# Patient Record
Sex: Female | Born: 1980 | Race: Asian | Hispanic: No | Marital: Married | Smoking: Never smoker
Health system: Southern US, Community
[De-identification: ages and names within clinical notes are randomized; demographics above are authoritative.]

## PROBLEM LIST (undated history)

## (undated) DIAGNOSIS — Z0000 Encounter for general adult medical examination without abnormal findings: Secondary | ICD-10-CM

## (undated) DIAGNOSIS — R0789 Other chest pain: Secondary | ICD-10-CM

## (undated) DIAGNOSIS — K5900 Constipation, unspecified: Secondary | ICD-10-CM

## (undated) HISTORY — DX: Constipation, unspecified: K59.00

## (undated) HISTORY — DX: Other chest pain: R07.89

## (undated) HISTORY — DX: Encounter for general adult medical examination without abnormal findings: Z00.00

---

## 2012-06-26 DIAGNOSIS — Z3490 Encounter for supervision of normal pregnancy, unspecified, unspecified trimester: Secondary | ICD-10-CM | POA: Insufficient documentation

## 2012-06-26 HISTORY — DX: Encounter for supervision of normal pregnancy, unspecified, unspecified trimester: Z34.90

## 2013-08-08 ENCOUNTER — Ambulatory Visit (INDEPENDENT_AMBULATORY_CARE_PROVIDER_SITE_OTHER): Payer: BC Managed Care – PPO | Admitting: Family Medicine

## 2013-08-08 ENCOUNTER — Encounter: Payer: Self-pay | Admitting: Family Medicine

## 2013-08-08 ENCOUNTER — Other Ambulatory Visit (HOSPITAL_COMMUNITY)
Admission: RE | Admit: 2013-08-08 | Discharge: 2013-08-08 | Disposition: A | Discharge status: Home / Self Care | Payer: BC Managed Care – PPO | Source: Ambulatory Visit | Attending: Family Medicine | Admitting: Family Medicine

## 2013-08-08 VITALS — BP 100/62 | HR 60 | Temp 97.9°F | Ht <= 58 in | Wt 114.1 lb

## 2013-08-08 DIAGNOSIS — N390 Urinary tract infection, site not specified: Secondary | ICD-10-CM

## 2013-08-08 DIAGNOSIS — N644 Mastodynia: Secondary | ICD-10-CM

## 2013-08-08 DIAGNOSIS — N760 Acute vaginitis: Secondary | ICD-10-CM | POA: Insufficient documentation

## 2013-08-08 DIAGNOSIS — Z113 Encounter for screening for infections with a predominantly sexual mode of transmission: Secondary | ICD-10-CM | POA: Insufficient documentation

## 2013-08-08 DIAGNOSIS — D649 Anemia, unspecified: Secondary | ICD-10-CM

## 2013-08-08 LAB — CBC
HCT: 39 % (ref 36.0–46.0)
HEMOGLOBIN: 13.1 g/dL (ref 12.0–15.0)
MCH: 31.3 pg (ref 26.0–34.0)
MCHC: 33.6 g/dL (ref 30.0–36.0)
MCV: 93.1 fL (ref 78.0–100.0)
Platelets: 274 10*3/uL (ref 150–400)
RBC: 4.19 MIL/uL (ref 3.87–5.11)
RDW: 13.2 % (ref 11.5–15.5)
WBC: 6.2 10*3/uL (ref 4.0–10.5)

## 2013-08-08 NOTE — Progress Notes (Signed)
Pre visit review using our clinic review tool, if applicable. No additional management support is needed unless otherwise documented below in the visit note. 

## 2013-08-08 NOTE — Patient Instructions (Addendum)
Pre visit review using our clinic review tool, if applicable. No additional management support is needed unless otherwise documented below in the visit  Probiotic daily such as Digestive Advantage or [employer] for Adults, Female A healthy lifestyle and preventive care can promote health and wellness. Preventive health guidelines for women include the following key practices.  A routine yearly physical is a good way to check with your health care provider about your health and preventive screening. It is a chance to share any concerns and updates on your health and to receive a thorough exam.  Visit your dentist for a routine exam and preventive care every 6 months. Brush your teeth twice a day and floss once a day. Good oral hygiene prevents tooth decay and gum disease.  The frequency of eye exams is based on your age, health, family medical history, use of contact lenses, and other factors. Follow your health care provider's recommendations for frequency of eye exams.  Eat a healthy diet. Foods like vegetables, fruits, whole grains, low-fat dairy products, and lean protein foods contain the nutrients you need without too many calories. Decrease your intake of foods high in solid fats, added sugars, and salt. Eat the right amount of calories for you.Get information about a proper diet from your health care provider, if necessary.  Regular physical exercise is one of the most important things you can do for your health. Most adults should get at least 150 minutes of moderate-intensity exercise (any activity that increases your heart rate and causes you to sweat) each week. In addition, most adults need muscle-strengthening exercises on 2 or more days a week.  Maintain a healthy weight. The body mass index (BMI) is a screening tool to identify possible weight problems. It provides an estimate of body fat based on height and weight. Your health care provider can find your  BMI, and can help you achieve or maintain a healthy weight.For adults 20 years and older:  A BMI below 18.5 is considered underweight.  A BMI of 18.5 to 24.9 is normal.  A BMI of 25 to 29.9 is considered overweight.  A BMI of 30 and above is considered obese.  Maintain normal blood lipids and cholesterol levels by exercising and minimizing your intake of saturated fat. Eat a balanced diet with plenty of fruit and vegetables. Blood tests for lipids and cholesterol should begin at age 19 and be repeated every 5 years. If your lipid or cholesterol levels are high, you are over 50, or you are at high risk for heart disease, you may need your cholesterol levels checked more frequently.Ongoing high lipid and cholesterol levels should be treated with medicines if diet and exercise are not working.  If you smoke, find out from your health care provider how to quit. If you do not use tobacco, do not start.  Lung cancer screening is recommended for adults aged 37 80 years who are at high risk for developing lung cancer because of a history of smoking. A yearly low-dose CT scan of the lungs is recommended for people who have at least a 30-pack-year history of smoking and are a current smoker or have quit within the past 15 years. A pack year of smoking is smoking an average of 1 pack of cigarettes a day for 1 year (for example: 1 pack a day for 30 years or 2 packs a day for 15 years). Yearly screening should continue until the smoker has stopped smoking for at  least 15 years. Yearly screening should be stopped for people who develop a health problem that would prevent them from having lung cancer treatment.  If you are pregnant, do not drink alcohol. If you are breastfeeding, be very cautious about drinking alcohol. If you are not pregnant and choose to drink alcohol, do not have more than 1 drink per day. One drink is considered to be 12 ounces (355 mL) of beer, 5 ounces (148 mL) of wine, or 1.5 ounces (44  mL) of liquor.  Avoid use of street drugs. Do not share needles with anyone. Ask for help if you need support or instructions about stopping the use of drugs.  High blood pressure causes heart disease and increases the risk of stroke. Your blood pressure should be checked at least every 1 to 2 years. Ongoing high blood pressure should be treated with medicines if weight loss and exercise do not work.  If you are 16 33 years old, ask your health care provider if you should take aspirin to prevent strokes.  Diabetes screening involves taking a blood sample to check your fasting blood sugar level. This should be done once every 3 years, after age 4, if you are within normal weight and without risk factors for diabetes. Testing should be considered at a younger age or be carried out more frequently if you are overweight and have at least 1 risk factor for diabetes.  Breast cancer screening is essential preventive care for women. You should practice "breast self-awareness." This means understanding the normal appearance and feel of your breasts and may include breast self-examination. Any changes detected, no matter how small, should be reported to a health care provider. Women in their 54s and 30s should have a clinical breast exam (CBE) by a health care provider as part of a regular health exam every 1 to 3 years. After age 68, women should have a CBE every year. Starting at age 57, women should consider having a mammogram (breast X-ray test) every year. Women who have a family history of breast cancer should talk to their health care provider about genetic screening. Women at a high risk of breast cancer should talk to their health care providers about having an MRI and a mammogram every year.  Breast cancer gene (BRCA)-related cancer risk assessment is recommended for women who have family members with BRCA-related cancers. BRCA-related cancers include breast, ovarian, tubal, and peritoneal cancers.  Having family members with these cancers may be associated with an increased risk for harmful changes (mutations) in the breast cancer genes BRCA1 and BRCA2. Results of the assessment will determine the need for genetic counseling and BRCA1 and BRCA2 testing.  The Pap test is a screening test for cervical cancer. A Pap test can show cell changes on the cervix that might become cervical cancer if left untreated. A Pap test is a procedure in which cells are obtained and examined from the lower end of the uterus (cervix).  Women should have a Pap test starting at age 38.  Between ages 82 and 66, Pap tests should be repeated every 2 years.  Beginning at age 65, you should have a Pap test every 3 years as long as the past 3 Pap tests have been normal.  Some women have medical problems that increase the chance of getting cervical cancer. Talk to your health care provider about these problems. It is especially important to talk to your health care provider if a new problem develops soon after your  last Pap test. In these cases, your health care provider may recommend more frequent screening and Pap tests.  The above recommendations are the same for women who have or have not gotten the vaccine for human papillomavirus (HPV).  If you had a hysterectomy for a problem that was not cancer or a condition that could lead to cancer, then you no longer need Pap tests. Even if you no longer need a Pap test, a regular exam is a good idea to make sure no other problems are starting.  If you are between ages 26 and 31 years, and you have had normal Pap tests going back 10 years, you no longer need Pap tests. Even if you no longer need a Pap test, a regular exam is a good idea to make sure no other problems are starting.  If you have had past treatment for cervical cancer or a condition that could lead to cancer, you need Pap tests and screening for cancer for at least 20 years after your treatment.  If Pap tests  have been discontinued, risk factors (such as a new sexual partner) need to be reassessed to determine if screening should be resumed.  The HPV test is an additional test that may be used for cervical cancer screening. The HPV test looks for the virus that can cause the cell changes on the cervix. The cells collected during the Pap test can be tested for HPV. The HPV test could be used to screen women aged 29 years and older, and should be used in women of any age who have unclear Pap test results. After the age of 70, women should have HPV testing at the same frequency as a Pap test.  Colorectal cancer can be detected and often prevented. Most routine colorectal cancer screening begins at the age of 40 years and continues through age 21 years. However, your health care provider may recommend screening at an earlier age if you have risk factors for colon cancer. On a yearly basis, your health care provider may provide home test kits to check for hidden blood in the stool. Use of a small camera at the end of a tube, to directly examine the colon (sigmoidoscopy or colonoscopy), can detect the earliest forms of colorectal cancer. Talk to your health care provider about this at age 60, when routine screening begins. Direct exam of the colon should be repeated every 5 10 years through age 34 years, unless early forms of pre-cancerous polyps or small growths are found.  People who are at an increased risk for hepatitis B should be screened for this virus. You are considered at high risk for hepatitis B if:  You were born in a country where hepatitis B occurs often. Talk with your health care provider about which countries are considered high risk.  Your parents were born in a high-risk country and you have not received a shot to protect against hepatitis B (hepatitis B vaccine).  You have HIV or AIDS.  You use needles to inject street drugs.  You live with, or have sex with, someone who has Hepatitis  B.  You get hemodialysis treatment.  You take certain medicines for conditions like cancer, organ transplantation, and autoimmune conditions.  Hepatitis C blood testing is recommended for all people born from [birth year] through 1965 and any individual with known risks for hepatitis C.  Practice safe sex. Use condoms and avoid high-risk sexual practices to reduce the spread of sexually transmitted infections (STIs). STIs include  gonorrhea, chlamydia, syphilis, trichomonas, herpes, HPV, and human immunodeficiency virus (HIV). Herpes, HIV, and HPV are viral illnesses that have no cure. They can result in disability, cancer, and death. Sexually active women aged 74 years and younger should be checked for chlamydia. Older women with new or multiple partners should also be tested for chlamydia. Testing for other STIs is recommended if you are sexually active and at increased risk.  Osteoporosis is a disease in which the bones lose minerals and strength with aging. This can result in serious bone fractures or breaks. The risk of osteoporosis can be identified using a bone density scan. Women ages 47 years and over and women at risk for fractures or osteoporosis should discuss screening with their health care providers. Ask your health care provider whether you should take a calcium supplement or vitamin D to reduce the rate of osteoporosis.  Menopause can be associated with physical symptoms and risks. Hormone replacement therapy is available to decrease symptoms and risks. You should talk to your health care provider about whether hormone replacement therapy is right for you.  Use sunscreen. Apply sunscreen liberally and repeatedly throughout the day. You should seek shade when your shadow is shorter than you. Protect yourself by wearing long sleeves, pants, a wide-brimmed hat, and sunglasses year round, whenever you are outdoors.  Once a month, do a whole body skin exam, using a mirror to look at the skin on  your back. Tell your health care provider of new moles, moles that have irregular borders, moles that are larger than a pencil eraser, or moles that have changed in shape or color.  Stay current with required vaccines (immunizations).  Influenza vaccine. All adults should be immunized every year.  Tetanus, diphtheria, and acellular pertussis (Td, Tdap) vaccine. Pregnant women should receive 1 dose of Tdap vaccine during each pregnancy. The dose should be obtained regardless of the length of time since the last dose. Immunization is preferred during the 27th 36th week of gestation. An adult who has not previously received Tdap or who does not know her vaccine status should receive 1 dose of Tdap. This initial dose should be followed by tetanus and diphtheria toxoids (Td) booster doses every 10 years. Adults with an unknown or incomplete history of completing a 3-dose immunization series with Td-containing vaccines should begin or complete a primary immunization series including a Tdap dose. Adults should receive a Td booster every 10 years.  Varicella vaccine. An adult without evidence of immunity to varicella should receive 2 doses or a second dose if she has previously received 1 dose. Pregnant females who do not have evidence of immunity should receive the first dose after pregnancy. This first dose should be obtained before leaving the health care facility. The second dose should be obtained 4 8 weeks after the first dose.  Human papillomavirus (HPV) vaccine. Females aged 32 26 years who have not received the vaccine previously should obtain the 3-dose series. The vaccine is not recommended for use in pregnant females. However, pregnancy testing is not needed before receiving a dose. If a female is found to be pregnant after receiving a dose, no treatment is needed. In that case, the remaining doses should be delayed until after the pregnancy. Immunization is recommended for any person with an  immunocompromised condition through the age of 33 years if she did not get any or all doses earlier. During the 3-dose series, the second dose should be obtained 4 8 weeks after the first dose.  The third dose should be obtained 24 weeks after the first dose and 16 weeks after the second dose.  Zoster vaccine. One dose is recommended for adults aged 8 years or older unless certain conditions are present.  Measles, mumps, and rubella (MMR) vaccine. Adults born before 80 generally are considered immune to measles and mumps. Adults born in [birth year] or later should have 1 or more doses of MMR vaccine unless there is a contraindication to the vaccine or there is laboratory evidence of immunity to each of the three diseases. A routine second dose of MMR vaccine should be obtained at least 28 days after the first dose for students attending postsecondary schools, health care workers, or international travelers. People who received inactivated measles vaccine or an unknown type of measles vaccine during 1963 1967 should receive 2 doses of MMR vaccine. People who received inactivated mumps vaccine or an unknown type of mumps vaccine before 1979 and are at high risk for mumps infection should consider immunization with 2 doses of MMR vaccine. For females of childbearing age, rubella immunity should be determined. If there is no evidence of immunity, females who are not pregnant should be vaccinated. If there is no evidence of immunity, females who are pregnant should delay immunization until after pregnancy. Unvaccinated health care workers born before [birth year] who lack laboratory evidence of measles, mumps, or rubella immunity or laboratory confirmation of disease should consider measles and mumps immunization with 2 doses of MMR vaccine or rubella immunization with 1 dose of MMR vaccine.  Pneumococcal 13-valent conjugate (PCV13) vaccine. When indicated, a person who is uncertain of her immunization history and has no record  of immunization should receive the PCV13 vaccine. An adult aged 37 years or older who has certain medical conditions and has not been previously immunized should receive 1 dose of PCV13 vaccine. This PCV13 should be followed with a dose of pneumococcal polysaccharide (PPSV23) vaccine. The PPSV23 vaccine dose should be obtained at least 8 weeks after the dose of PCV13 vaccine. An adult aged 51 years or older who has certain medical conditions and previously received 1 or more doses of PPSV23 vaccine should receive 1 dose of PCV13. The PCV13 vaccine dose should be obtained 1 or more years after the last PPSV23 vaccine dose.  Pneumococcal polysaccharide (PPSV23) vaccine. When PCV13 is also indicated, PCV13 should be obtained first. All adults aged 42 years and older should be immunized. An adult younger than age 63 years who has certain medical conditions should be immunized. Any person who resides in a nursing home or long-term care facility should be immunized. An adult smoker should be immunized. People with an immunocompromised condition and certain other conditions should receive both PCV13 and PPSV23 vaccines. People with human immunodeficiency virus (HIV) infection should be immunized as soon as possible after diagnosis. Immunization during chemotherapy or radiation therapy should be avoided. Routine use of PPSV23 vaccine is not recommended for American Indians, [place] Natives, or people younger than 65 years unless there are medical conditions that require PPSV23 vaccine. When indicated, people who have unknown immunization and have no record of immunization should receive PPSV23 vaccine. One-time revaccination 5 years after the first dose of PPSV23 is recommended for people aged 64 64 years who have chronic kidney failure, nephrotic syndrome, asplenia, or immunocompromised conditions. People who received 1 2 doses of PPSV23 before age 39 years should receive another dose of PPSV23 vaccine at age 6 years or  later if at least 5 years have passed  since the previous dose. Doses of PPSV23 are not needed for people immunized with PPSV23 at or after age 18 years.  Meningococcal vaccine. Adults with asplenia or persistent complement component deficiencies should receive 2 doses of quadrivalent meningococcal conjugate (MenACWY-D) vaccine. The doses should be obtained at least 2 months apart. Microbiologists working with certain meningococcal bacteria, [place] recruits, people at risk during an outbreak, and people who travel to or live in countries with a high rate of meningitis should be immunized. A first-year college student up through age 79 years who is living in a residence hall should receive a dose if she did not receive a dose on or after her 16th birthday. Adults who have certain high-risk conditions should receive one or more doses of vaccine.  Hepatitis A vaccine. Adults who wish to be protected from this disease, have certain high-risk conditions, work with hepatitis A-infected animals, work in hepatitis A research labs, or travel to or work in countries with a high rate of hepatitis A should be immunized. Adults who were previously unvaccinated and who anticipate close contact with an international adoptee during the first 60 days after arrival in the [place] from a country with a high rate of hepatitis A should be immunized.  Hepatitis B vaccine. Adults who wish to be protected from this disease, have certain high-risk conditions, may be exposed to blood or other infectious body fluids, are household contacts or sex partners of hepatitis B positive people, are clients or workers in certain care facilities, or travel to or work in countries with a high rate of hepatitis B should be immunized.  Haemophilus influenzae type b (Hib) vaccine. A previously unvaccinated person with asplenia or sickle cell disease or having a scheduled splenectomy should receive 1 dose of Hib vaccine. Regardless of  previous immunization, a recipient of a hematopoietic stem cell transplant should receive a 3-dose series 6 12 months after her successful transplant. Hib vaccine is not recommended for adults with HIV infection. Preventive Services / Frequency Ages 37 to 39years  Blood pressure check.** / Every 1 to 2 years.  Lipid and cholesterol check.** / Every 5 years beginning at age 40.  Clinical breast exam.** / Every 3 years for women in their 61s and 74s.  BRCA-related cancer risk assessment.** / For women who have family members with a BRCA-related cancer (breast, ovarian, tubal, or peritoneal cancers).  Pap test.** / Every 2 years from ages 80 through 63. Every 3 years starting at age 9 through age 88 or 38 with a history of 3 consecutive normal Pap tests.  HPV screening.** / Every 3 years from ages 28 through ages 43 to 4 with a history of 3 consecutive normal Pap tests.  Hepatitis C blood test.** / For any individual with known risks for hepatitis C.  Skin self-exam. / Monthly.  Influenza vaccine. / Every year.  Tetanus, diphtheria, and acellular pertussis (Tdap, Td) vaccine.** / Consult your health care provider. Pregnant women should receive 1 dose of Tdap vaccine during each pregnancy. 1 dose of Td every 10 years.  Varicella vaccine.** / Consult your health care provider. Pregnant females who do not have evidence of immunity should receive the first dose after pregnancy.  HPV vaccine. / 3 doses over 6 months, if 27 and younger. The vaccine is not recommended for use in pregnant females. However, pregnancy testing is not needed before receiving a dose.  Measles, mumps, rubella (MMR) vaccine.** / You need at least 1 dose of MMR if  you were born in [birth year] or later. You may also need a 2nd dose. For females of childbearing age, rubella immunity should be determined. If there is no evidence of immunity, females who are not pregnant should be vaccinated. If there is no evidence of immunity,  females who are pregnant should delay immunization until after pregnancy.  Pneumococcal 13-valent conjugate (PCV13) vaccine.** / Consult your health care provider.  Pneumococcal polysaccharide (PPSV23) vaccine.** / 1 to 2 doses if you smoke cigarettes or if you have certain conditions.  Meningococcal vaccine.** / 1 dose if you are age 18 to 85 years and a Market researcher living in a residence hall, or have one of several medical conditions, you need to get vaccinated against meningococcal disease. You may also need additional booster doses.  Hepatitis A vaccine.** / Consult your health care provider.  Hepatitis B vaccine.** / Consult your health care provider.  Haemophilus influenzae type b (Hib) vaccine.** / Consult your health care provider. Ages 52 to 64years  Blood pressure check.** / Every 1 to 2 years.  Lipid and cholesterol check.** / Every 5 years beginning at age 15 years.  Lung cancer screening. / Every year if you are aged 12 80 years and have a 30-pack-year history of smoking and currently smoke or have quit within the past 15 years. Yearly screening is stopped once you have quit smoking for at least 15 years or develop a health problem that would prevent you from having lung cancer treatment.  Clinical breast exam.** / Every year after age 53 years.  BRCA-related cancer risk assessment.** / For women who have family members with a BRCA-related cancer (breast, ovarian, tubal, or peritoneal cancers).  Mammogram.** / Every year beginning at age 1 years and continuing for as long as you are in good health. Consult with your health care provider.  Pap test.** / Every 3 years starting at age 98 years through age 17 or 49 years with a history of 3 consecutive normal Pap tests.  HPV screening.** / Every 3 years from ages 72 years through ages 4 to 53 years with a history of 3 consecutive normal Pap tests.  Fecal occult blood test (FOBT) of stool. / Every year  beginning at age 62 years and continuing until age 70 years. You may not need to do this test if you get a colonoscopy every 10 years.  Flexible sigmoidoscopy or colonoscopy.** / Every 5 years for a flexible sigmoidoscopy or every 10 years for a colonoscopy beginning at age 9 years and continuing until age 22 years.  Hepatitis C blood test.** / For all people born from [birth year] through 1965 and any individual with known risks for hepatitis C.  Skin self-exam. / Monthly.  Influenza vaccine. / Every year.  Tetanus, diphtheria, and acellular pertussis (Tdap/Td) vaccine.** / Consult your health care provider. Pregnant women should receive 1 dose of Tdap vaccine during each pregnancy. 1 dose of Td every 10 years.  Varicella vaccine.** / Consult your health care provider. Pregnant females who do not have evidence of immunity should receive the first dose after pregnancy.  Zoster vaccine.** / 1 dose for adults aged 61 years or older.  Measles, mumps, rubella (MMR) vaccine.** / You need at least 1 dose of MMR if you were born in 1957 or later. You may also need a 2nd dose. For females of childbearing age, rubella immunity should be determined. If there is no evidence of immunity, females who are not pregnant should be vaccinated. If  there is no evidence of immunity, females who are pregnant should delay immunization until after pregnancy.  Pneumococcal 13-valent conjugate (PCV13) vaccine.** / Consult your health care provider.  Pneumococcal polysaccharide (PPSV23) vaccine.** / 1 to 2 doses if you smoke cigarettes or if you have certain conditions.  Meningococcal vaccine.** / Consult your health care provider.  Hepatitis A vaccine.** / Consult your health care provider.  Hepatitis B vaccine.** / Consult your health care provider.  Haemophilus influenzae type b (Hib) vaccine.** / Consult your health care provider. Ages 53 years and over  Blood pressure check.** / Every 1 to 2 years.  Lipid and  cholesterol check.** / Every 5 years beginning at age 53 years.  Lung cancer screening. / Every year if you are aged 39 80 years and have a 30-pack-year history of smoking and currently smoke or have quit within the past 15 years. Yearly screening is stopped once you have quit smoking for at least 15 years or develop a health problem that would prevent you from having lung cancer treatment.  Clinical breast exam.** / Every year after age 85 years.  BRCA-related cancer risk assessment.** / For women who have family members with a BRCA-related cancer (breast, ovarian, tubal, or peritoneal cancers).  Mammogram.** / Every year beginning at age 19 years and continuing for as long as you are in good health. Consult with your health care provider.  Pap test.** / Every 3 years starting at age 17 years through age 101 or 95 years with 3 consecutive normal Pap tests. Testing can be stopped between 65 and 70 years with 3 consecutive normal Pap tests and no abnormal Pap or HPV tests in the past 10 years.  HPV screening.** / Every 3 years from ages 49 years through ages 31 or 57 years with a history of 3 consecutive normal Pap tests. Testing can be stopped between 65 and 70 years with 3 consecutive normal Pap tests and no abnormal Pap or HPV tests in the past 10 years.  Fecal occult blood test (FOBT) of stool. / Every year beginning at age 55 years and continuing until age 47 years. You may not need to do this test if you get a colonoscopy every 10 years.  Flexible sigmoidoscopy or colonoscopy.** / Every 5 years for a flexible sigmoidoscopy or every 10 years for a colonoscopy beginning at age 26 years and continuing until age 32 years.  Hepatitis C blood test.** / For all people born from [birth year] through 1965 and any individual with known risks for hepatitis C.  Osteoporosis screening.** / A one-time screening for women ages 20 years and over and women at risk for fractures or osteoporosis.  Skin self-exam. /  Monthly.  Influenza vaccine. / Every year.  Tetanus, diphtheria, and acellular pertussis (Tdap/Td) vaccine.** / 1 dose of Td every 10 years.  Varicella vaccine.** / Consult your health care provider.  Zoster vaccine.** / 1 dose for adults aged 85 years or older.  Pneumococcal 13-valent conjugate (PCV13) vaccine.** / Consult your health care provider.  Pneumococcal polysaccharide (PPSV23) vaccine.** / 1 dose for all adults aged 21 years and older.  Meningococcal vaccine.** / Consult your health care provider.  Hepatitis A vaccine.** / Consult your health care provider.  Hepatitis B vaccine.** / Consult your health care provider.  Haemophilus influenzae type b (Hib) vaccine.** / Consult your health care provider. ** Family history and personal history of risk and conditions may change your health care provider's recommendations. Document Released: 04/25/2001 Document Revised:  12/18/2012 Document Reviewed: 07/25/2010 [employer] Patient Information 2014 [place].

## 2013-08-09 LAB — URINALYSIS
Bilirubin Urine: NEGATIVE
Glucose, UA: NEGATIVE mg/dL
Hgb urine dipstick: NEGATIVE
Ketones, ur: NEGATIVE mg/dL
Leukocytes, UA: NEGATIVE
Nitrite: NEGATIVE
PH: 6.5 (ref 5.0–8.0)
Protein, ur: NEGATIVE mg/dL
SPECIFIC GRAVITY, URINE: 1.019 (ref 1.005–1.030)
Urobilinogen, UA: 0.2 mg/dL (ref 0.0–1.0)

## 2013-08-09 LAB — TSH: TSH: 1.237 u[IU]/mL (ref 0.350–4.500)

## 2013-08-10 DIAGNOSIS — N390 Urinary tract infection, site not specified: Secondary | ICD-10-CM | POA: Insufficient documentation

## 2013-08-10 DIAGNOSIS — D649 Anemia, unspecified: Secondary | ICD-10-CM | POA: Insufficient documentation

## 2013-08-10 DIAGNOSIS — N644 Mastodynia: Secondary | ICD-10-CM | POA: Insufficient documentation

## 2013-08-10 DIAGNOSIS — N760 Acute vaginitis: Secondary | ICD-10-CM | POA: Insufficient documentation

## 2013-08-10 LAB — URINE CULTURE: Colony Count: 2000

## 2013-08-10 NOTE — Assessment & Plan Note (Signed)
Is nursing her infant, likely normal variant tissue and discomfort. No obvious sign or symptoms of mastitis. Will refer for breast ultrasound for reassurance.

## 2013-08-10 NOTE — Progress Notes (Signed)
Patient ID: Heather Mann, female   DOB: 16-Dec-1980, 33 y.o.   MRN: [id number] Heather Mann [id number] 07-14-1980 08/10/2013      Progress Note New Patient  Subjective  Chief Complaint  Chief Complaint  Patient presents with  . Establish Care    new patient    HPI  Patient is a 33 year old female in today for routine medical care. Patient is here today to establish care. She has a 26-month-old daughter she is nursing. She has significant rest pain and swelling. Denies fevers, chills, malaise, myalgias. She is complaining of vaginal itching and discharge. She denies abdominal pain or back pain. She has some urinary frequency and urgency but no dysuria or hematuria. Denies CP/palp/SOB/HA/congestion/fevers/GI or GU c/o. Taking meds as prescribed  History reviewed. No pertinent past medical history.  Past Surgical History  Procedure Laterality Date  . Cesarean section  01-22-13    History reviewed. No pertinent family history.  History   Social History  . Marital Status: Married    Spouse Name: N/A    Number of Children: N/A  . Years of Education: N/A   Occupational History  . Not on file.   Social History Main Topics  . Smoking status: Never Smoker   . Smokeless tobacco: Never Used  . Alcohol Use: No  . Drug Use: No  . Sexual Activity: Yes    Partners: Male   Other Topics Concern  . Not on file   Social History Narrative  . No narrative on file    No current outpatient prescriptions on file prior to visit.   No current facility-administered medications on file prior to visit.    No Known Allergies  Review of Systems  Review of Systems  Constitutional: Negative for fever and malaise/fatigue.  HENT: Negative for congestion.   Eyes: Negative for discharge.  Respiratory: Negative for shortness of breath.   Cardiovascular: Negative for chest pain, palpitations and leg swelling.  Gastrointestinal: Negative for nausea, abdominal pain and diarrhea.   Genitourinary: Positive for frequency. Negative for dysuria.  Musculoskeletal: Negative for falls.  Skin: Negative for rash.  Neurological: Negative for loss of consciousness and headaches.  Endo/Heme/Allergies: Negative for polydipsia.  Psychiatric/Behavioral: Negative for depression and suicidal ideas. The patient is not nervous/anxious and does not have insomnia.     Objective  BP 100/62  Pulse 60  Temp(Src) 97.9 F (36.6 C) (Oral)  Ht 4\' 10"  (1.473 m)  Wt 114 lb 1.3 oz (51.746 kg)  BMI 23.85 kg/m2  SpO2 98%  Physical Exam  Physical Exam  Constitutional: She is oriented to person, place, and time and well-developed, well-nourished, and in no distress. No distress.  HENT:  Head: Normocephalic and atraumatic.  Eyes: Conjunctivae are normal.  Neck: Neck supple. No thyromegaly present.  Cardiovascular: Normal rate, regular rhythm and normal heart sounds.   No murmur heard. Pulmonary/Chest: Effort normal and breath sounds normal. She has no wheezes.  Abdominal: Soft. Bowel sounds are normal. She exhibits no distension and no mass.  Genitourinary:  Breasts with significant lumps and tenderness. b/l  Musculoskeletal: She exhibits no edema.  Lymphadenopathy:    She has no cervical adenopathy.  Neurological: She is alert and oriented to person, place, and time.  Skin: Skin is warm and dry. No rash noted. She is not diaphoretic.  Psychiatric: Memory, affect and judgment normal.       Assessment & Plan  UTI (urinary tract infection) H/o uti, encouraged probiotics and increased hydration  Breast pain  in female Is nursing her infant, likely normal variant tissue and discomfort. No obvious sign or symptoms of mastitis. Will refer for breast ultrasound for reassurance.  Anemia Check CBC continue prenatal vitamin  Vaginitis and vulvovaginitis Encouraged probiotics and check for yeast and vaginitis

## 2013-08-10 NOTE — Assessment & Plan Note (Signed)
Check CBC continue prenatal vitamin

## 2013-08-10 NOTE — Assessment & Plan Note (Signed)
H/o uti, encouraged probiotics and increased hydration

## 2013-08-10 NOTE — Assessment & Plan Note (Signed)
Encouraged probiotics and check for yeast and vaginitis

## 2013-08-18 ENCOUNTER — Other Ambulatory Visit: Payer: Self-pay | Admitting: Family Medicine

## 2013-08-18 DIAGNOSIS — N644 Mastodynia: Secondary | ICD-10-CM

## 2013-09-05 ENCOUNTER — Ambulatory Visit
Admission: RE | Admit: 2013-09-05 | Discharge: 2013-09-05 | Disposition: A | Discharge status: Home / Self Care | Payer: BC Managed Care – PPO | Source: Ambulatory Visit | Attending: Family Medicine | Admitting: Family Medicine

## 2013-09-05 DIAGNOSIS — N644 Mastodynia: Secondary | ICD-10-CM

## 2013-12-19 ENCOUNTER — Encounter: Payer: Self-pay | Admitting: Family

## 2013-12-19 ENCOUNTER — Ambulatory Visit (INDEPENDENT_AMBULATORY_CARE_PROVIDER_SITE_OTHER): Payer: BC Managed Care – PPO | Admitting: Family

## 2013-12-19 VITALS — BP 104/61 | HR 70 | Temp 98.5°F | Resp 16 | Ht <= 58 in | Wt 108.4 lb

## 2013-12-19 DIAGNOSIS — M791 Myalgia, unspecified site: Secondary | ICD-10-CM

## 2013-12-19 DIAGNOSIS — D508 Other iron deficiency anemias: Secondary | ICD-10-CM

## 2013-12-19 MED ORDER — MULTI-VITAMIN/MINERALS PO TABS: 1.0000 | ORAL_TABLET | Freq: Every day | ORAL | Status: DC

## 2013-12-19 MED ORDER — MELOXICAM 7.5 MG PO TABS: 7.5000 mg | ORAL_TABLET | Freq: Every day | ORAL | Status: DC

## 2013-12-19 NOTE — Patient Instructions (Signed)
Start meloxicam for your back. This can be taken once daily for the next 1-2 weeks. Call if your back pain worsens, or if it does not improve with the meloxicam.  It is OK to stop iron supplement.  Instead take a multivitamin with minerals once daily.

## 2013-12-19 NOTE — Progress Notes (Signed)
   Subjective:    Patient ID: Heather Mann, female    DOB: 03/23/1980, 34 y.o.   MRN: [id number]  HPI  Heather Mann is a 33 yr old female who presents today with chief complaint of pain in the upper back.  Has some tingling across her chest. Reports fatigue in her upper shoulders. Symptoms started 1 year ago.  Pain is intermittent.  Pain is improved by nothing.  Pt works folding clothes.     Anemia- pt reports that she was anemic during her pregnancy and was placed on iron at that time.  She reports that she continues to take iron.    Review of Systems See HPI  No past medical history on file.  History   Social History  . Marital Status: Married    Spouse Name: N/A    Number of Children: N/A  . Years of Education: N/A   Occupational History  . Not on file.   Social History Main Topics  . Smoking status: Never Smoker   . Smokeless tobacco: Never Used  . Alcohol Use: No  . Drug Use: No  . Sexual Activity: Yes    Partners: Male   Other Topics Concern  . Not on file   Social History Narrative  . No narrative on file    Past Surgical History  Procedure Laterality Date  . Cesarean section  01-22-13    No family history on file.  Allergies  Allergen Reactions  . Oxycodone Hives    No current outpatient prescriptions on file prior to visit.   No current facility-administered medications on file prior to visit.    BP 104/61  Pulse 70  Temp(Src) 98.5 F (36.9 C) (Oral)  Resp 16  Ht 4\' 10"  (1.473 m)  Wt 108 lb 6.4 oz (49.17 kg)  BMI 22.66 kg/m2  SpO2 100%  LMP 12/01/2013       Objective:   Physical Exam  Constitutional: She appears well-developed and well-nourished. No distress.  Cardiovascular: Normal rate and regular rhythm.   No murmur heard. Pulmonary/Chest: Effort normal and breath sounds normal. No respiratory distress. She has no wheezes. She has no rales. She exhibits no tenderness.  Musculoskeletal:  + tenderness to palpation of thoracic spine  between scapula          Assessment & Plan:

## 2013-12-19 NOTE — Progress Notes (Signed)
Pre visit review using our clinic review tool, if applicable. No additional management support is needed unless otherwise documented below in the visit note. 

## 2013-12-21 DIAGNOSIS — M791 Myalgia, unspecified site: Secondary | ICD-10-CM | POA: Insufficient documentation

## 2013-12-21 NOTE — Assessment & Plan Note (Signed)
Trial of meloxicam, follow up if symptoms worsen or if symptoms do not improve.

## 2013-12-21 NOTE — Assessment & Plan Note (Signed)
Lab Results  Component Value Date   WBC 6.2 08/08/2013   HGB 13.1 08/08/2013   HCT 39.0 08/08/2013   MCV 93.1 08/08/2013   PLT 274 08/08/2013   Most recent blood count is normal, she is no longer pregnant or nursing, advised pt ok to d/c iron supplment and start mvi with minerals.

## 2014-02-02 ENCOUNTER — Encounter: Payer: Self-pay | Admitting: Family Medicine

## 2014-02-02 ENCOUNTER — Ambulatory Visit (INDEPENDENT_AMBULATORY_CARE_PROVIDER_SITE_OTHER): Payer: BC Managed Care – PPO | Admitting: Family Medicine

## 2014-02-02 VITALS — BP 109/62 | HR 64 | Temp 97.8°F | Ht <= 58 in | Wt 110.4 lb

## 2014-02-02 DIAGNOSIS — N390 Urinary tract infection, site not specified: Secondary | ICD-10-CM | POA: Diagnosis not present

## 2014-02-02 DIAGNOSIS — N644 Mastodynia: Secondary | ICD-10-CM

## 2014-02-02 DIAGNOSIS — Z0000 Encounter for general adult medical examination without abnormal findings: Secondary | ICD-10-CM

## 2014-02-02 DIAGNOSIS — N649 Disorder of breast, unspecified: Secondary | ICD-10-CM

## 2014-02-02 DIAGNOSIS — R319 Hematuria, unspecified: Secondary | ICD-10-CM

## 2014-02-02 DIAGNOSIS — D649 Anemia, unspecified: Secondary | ICD-10-CM | POA: Diagnosis not present

## 2014-02-02 DIAGNOSIS — M791 Myalgia, unspecified site: Secondary | ICD-10-CM

## 2014-02-02 DIAGNOSIS — K5900 Constipation, unspecified: Secondary | ICD-10-CM

## 2014-02-02 LAB — URINALYSIS, ROUTINE W REFLEX MICROSCOPIC
Bilirubin Urine: NEGATIVE
Ketones, ur: NEGATIVE
LEUKOCYTES UA: NEGATIVE
Nitrite: NEGATIVE
Specific Gravity, Urine: 1.01 (ref 1.000–1.030)
Total Protein, Urine: NEGATIVE
UROBILINOGEN UA: 0.2 (ref 0.0–1.0)
Urine Glucose: NEGATIVE
pH: 6 (ref 5.0–8.0)

## 2014-02-02 LAB — CBC
HCT: 38.6 % (ref 36.0–46.0)
HEMOGLOBIN: 12.6 g/dL (ref 12.0–15.0)
MCHC: 32.7 g/dL (ref 30.0–36.0)
MCV: 94.9 fl (ref 78.0–100.0)
PLATELETS: 255 10*3/uL (ref 150.0–400.0)
RBC: 4.07 Mil/uL (ref 3.87–5.11)
RDW: 13 % (ref 11.5–15.5)
WBC: 4.4 10*3/uL (ref 4.0–10.5)

## 2014-02-02 LAB — RENAL FUNCTION PANEL
ALBUMIN: 4.2 g/dL (ref 3.5–5.2)
BUN: 13 mg/dL (ref 6–23)
CO2: 26 meq/L (ref 19–32)
Calcium: 9.4 mg/dL (ref 8.4–10.5)
Chloride: 103 mEq/L (ref 96–112)
Creatinine, Ser: 0.6 mg/dL (ref 0.4–1.2)
GFR: 127.17 mL/min (ref >60.00)
Glucose, Bld: 72 mg/dL (ref 70–99)
POTASSIUM: 3.6 meq/L (ref 3.5–5.1)
Phosphorus: 3.9 mg/dL (ref 2.3–4.6)
SODIUM: 138 meq/L (ref 135–145)

## 2014-02-02 LAB — HEPATIC FUNCTION PANEL
ALBUMIN: 4.2 g/dL (ref 3.5–5.2)
ALT: 20 U/L (ref 0–35)
AST: 25 U/L (ref 0–37)
Alkaline Phosphatase: 68 U/L (ref 39–117)
Bilirubin, Direct: 0 mg/dL (ref 0.0–0.3)
TOTAL PROTEIN: 7.2 g/dL (ref 6.0–8.3)
Total Bilirubin: 0.7 mg/dL (ref 0.2–1.2)

## 2014-02-02 LAB — LIPID PANEL
CHOL/HDL RATIO: 3
CHOLESTEROL: 194 mg/dL (ref 0–200)
HDL: 65.7 mg/dL (ref >39.00)
LDL CALC: 117 mg/dL — AB (ref 0–99)
NonHDL: 128.3
Triglycerides: 58 mg/dL (ref 0.0–149.0)
VLDL: 11.6 mg/dL (ref 0.0–40.0)

## 2014-02-02 LAB — TSH: TSH: 1.58 u[IU]/mL (ref 0.35–4.50)

## 2014-02-02 NOTE — Progress Notes (Signed)
Heather Mann  [id number] 1980/11/09 02/02/2014      Progress Note-Follow Up  Subjective  Chief Complaint  Chief Complaint  Patient presents with  . Annual Exam    physical    HPI  Patient is a 33 y.o. female in today for routine medical care. She is here today with her husband and her young daughter. She is complaining of some trouble with constipation. Has trouble moving her bowels once or twice a week. Has been using some Citrucel with marginal improvement. He did and was at times. Denies any bloody or tarry stool but does note defecation is sometimes difficult and somewhat painful. Has trouble with hemorrhoids intermittently. No fevers or chills. She is complaining of some breast discomfort off and on since she stopped nursing her in September 2015. Has some concerning nodules in the left greater than the right. No skin changes. No recent discharge. Is struggling with some low back pain as well after heavy lifting and has a small patch of paresthesia on her back as well. She says it is present over where her epidural was placed for delivery. Denies CP/palp/SOB/HA/congestion/fevers/GI or GU c/o. Taking meds as prescribed  History reviewed. No pertinent past medical history.  Past Surgical History  Procedure Laterality Date  . Cesarean section  01-22-13    History reviewed. No pertinent family history.  History   Social History  . Marital Status: Married    Spouse Name: N/A    Number of Children: N/A  . Years of Education: N/A   Occupational History  . Not on file.   Social History Main Topics  . Smoking status: Never Smoker   . Smokeless tobacco: Never Used  . Alcohol Use: No  . Drug Use: No  . Sexual Activity:    Partners: Male   Other Topics Concern  . Not on file   Social History Narrative    Current Outpatient Prescriptions on File Prior to Visit  Medication Sig Dispense Refill  . Multiple Vitamins-Minerals (MULTIVITAMIN WITH MINERALS) tablet Take 1  tablet by mouth daily.     No current facility-administered medications on file prior to visit.    Allergies  Allergen Reactions  . Oxycodone Hives    Review of Systems  Review of Systems  Constitutional: Negative for fever, chills and malaise/fatigue.  HENT: Negative for congestion, hearing loss and nosebleeds.   Eyes: Negative for discharge.  Respiratory: Negative for cough, sputum production, shortness of breath and wheezing.   Cardiovascular: Negative for chest pain, palpitations and leg swelling.  Gastrointestinal: Positive for constipation. Negative for heartburn, nausea, vomiting, abdominal pain, diarrhea and blood in stool.  Genitourinary: Negative for dysuria, urgency, frequency and hematuria.  Musculoskeletal: Positive for back pain. Negative for myalgias and falls.       Thoracic back discomfort she describes as prickly discomfort since delivery of her baby  Skin: Negative for rash.  Neurological: Negative for dizziness, tremors, sensory change, focal weakness, loss of consciousness, weakness and headaches.  Endo/Heme/Allergies: Negative for polydipsia. Does not bruise/bleed easily.  Psychiatric/Behavioral: Negative for depression and suicidal ideas. The patient is not nervous/anxious and does not have insomnia.     Objective  BP 109/62 mmHg  Pulse 64  Temp(Src) 97.8 F (36.6 C) (Oral)  Ht 4\' 10"  (1.473 m)  Wt 110 lb 6.4 oz (50.077 kg)  BMI 23.08 kg/m2  SpO2 100%  Physical Exam  Physical Exam  Constitutional: She is oriented to person, place, and time and well-developed, well-nourished, and in  no distress. No distress.  HENT:  Head: Normocephalic and atraumatic.  Right Ear: External ear normal.  Left Ear: External ear normal.  Nose: Nose normal.  Mouth/Throat: Oropharynx is clear and moist. No oropharyngeal exudate.  Eyes: Conjunctivae are normal. Pupils are equal, round, and reactive to light. Right eye exhibits no discharge. Left eye exhibits no  discharge. No scleral icterus.  Neck: Normal range of motion. Neck supple. No thyromegaly present.  Cardiovascular: Normal rate, regular rhythm, normal heart sounds and intact distal pulses.   No murmur heard. Pulmonary/Chest: Effort normal and breath sounds normal. No respiratory distress. She has no wheezes. She has no rales.  Abdominal: Soft. Bowel sounds are normal. She exhibits no distension and no mass. There is no tenderness.  Musculoskeletal: Normal range of motion. She exhibits no edema or tenderness.  Lymphadenopathy:    She has no cervical adenopathy.  Neurological: She is alert and oriented to person, place, and time. She has normal reflexes. No cranial nerve deficit. Coordination normal.  Skin: Skin is warm and dry. No rash noted. She is not diaphoretic.  Psychiatric: Mood, memory and affect normal.    Lab Results  Component Value Date   TSH 1.237 08/08/2013   Lab Results  Component Value Date   WBC 6.2 08/08/2013   HGB 13.1 08/08/2013   HCT 39.0 08/08/2013   MCV 93.1 08/08/2013   PLT 274 08/08/2013    Assessment & Plan  Constipation Encouraged increased hydration and fiber in diet. Daily probiotics. If bowels not moving can use MOM 2 tbls po in 4 oz of warm prune juice by mouth every 2-3 days. If no results then repeat in 4 hours with  Dulcolax suppository pr, may repeat again in 4 more hours as needed. Seek care if symptoms worsen. Consider daily Miralax and/or Dulcolax if symptoms persist.   Muscular pain Encouraged moist heat and gentle stretching as tolerated. May try NSAIDs and prescription meds as directed and report if symptoms worsen or seek immediate care  Breast pain in female On right, questionable lesion, likely fibrocystic. Will proceed with MGM.  Preventative health care Patient encouraged to maintain heart healthy diet, regular exercise, adequate sleep. Consider daily probiotics. Take medications as prescribed. Took flu shot in October

## 2014-02-02 NOTE — Patient Instructions (Signed)
Encouraged increased hydration and fiber in diet. Daily probiotics. If bowels not moving can use MOM 2 tbls po in 4 oz of warm prune juice by mouth every 2-3 days. If no results then repeat in 4 hours with  Dulcolax suppository pr, may repeat again in 4 more hours as needed. Seek care if symptoms worsen. Consider daily Miralax and/or Dulcolax if symptoms persist.   Probiotic daily such as Digestive Advantage or [employer]'s [employer] [facility]    Constipation Constipation is when a person has fewer than three bowel movements a week, has difficulty having a bowel movement, or has stools that are dry, hard, or larger than normal. As people grow older, constipation is more common. If you try to fix constipation with medicines that make you have a bowel movement (laxatives), the problem may get worse. Long-term laxative use may cause the muscles of the colon to become weak. A low-fiber diet, not taking in enough fluids, and taking certain medicines may make constipation worse.  CAUSES   Certain medicines, such as antidepressants, pain medicine, iron supplements, antacids, and water pills.   Certain diseases, such as diabetes, irritable bowel syndrome (IBS), thyroid disease, or depression.   Not drinking enough water.   Not eating enough fiber-rich foods.   Stress or travel.   Lack of physical activity or exercise.   Ignoring the urge to have a bowel movement.   Using laxatives too much.  SIGNS AND SYMPTOMS   Having fewer than three bowel movements a week.   Straining to have a bowel movement.   Having stools that are hard, dry, or larger than normal.   Feeling full or bloated.   Pain in the lower abdomen.   Not feeling relief after having a bowel movement.  DIAGNOSIS  Your health care provider will take a medical history and perform a physical exam. Further testing may be done for severe constipation. Some tests may include:  A barium enema X-ray to examine your rectum, colon,  and, sometimes, your small intestine.   A sigmoidoscopy to examine your lower colon.   A colonoscopy to examine your entire colon. TREATMENT  Treatment will depend on the severity of your constipation and what is causing it. Some dietary treatments include drinking more fluids and eating more fiber-rich foods. Lifestyle treatments may include regular exercise. If these diet and lifestyle recommendations do not help, your health care provider may recommend taking over-the-counter laxative medicines to help you have bowel movements. Prescription medicines may be prescribed if over-the-counter medicines do not work.  HOME CARE INSTRUCTIONS   Eat foods that have a lot of fiber, such as fruits, vegetables, whole grains, and beans.  Limit foods high in fat and processed sugars, such as french fries, hamburgers, cookies, candies, and soda.   A fiber supplement may be added to your diet if you cannot get enough fiber from foods.   Drink enough fluids to keep your urine clear or pale yellow.   Exercise regularly or as directed by your health care provider.   Go to the restroom when you have the urge to go. Do not hold it.   Only take over-the-counter or prescription medicines as directed by your health care provider. Do not take other medicines for constipation without talking to your health care provider first.  [place] IF:   You have bright red blood in your stool.   Your constipation lasts for more than 4 days or gets worse.   You have abdominal or  rectal pain.   You have thin, pencil-like stools.   You have unexplained weight loss. MAKE SURE YOU:   Understand these instructions.  Will watch your condition.  Will get help right away if you are not doing well or get worse. Document Released: 11/26/2003 Document Revised: 03/04/2013 Document Reviewed: 12/09/2012 [facility] Patient Information 2015 [place], [place]. This information is not intended to  replace advice given to you by your health care provider. Make sure you discuss any questions you have with your health care provider.

## 2014-02-02 NOTE — Progress Notes (Signed)
Pre visit review using our clinic review tool, if applicable. No additional management support is needed unless otherwise documented below in the visit note. 

## 2014-02-08 ENCOUNTER — Encounter: Payer: Self-pay | Admitting: Family Medicine

## 2014-02-08 DIAGNOSIS — Z0000 Encounter for general adult medical examination without abnormal findings: Secondary | ICD-10-CM

## 2014-02-08 DIAGNOSIS — K5900 Constipation, unspecified: Secondary | ICD-10-CM

## 2014-02-08 HISTORY — DX: Constipation, unspecified: K59.00

## 2014-02-08 HISTORY — DX: Encounter for general adult medical examination without abnormal findings: Z00.00

## 2014-02-08 NOTE — Assessment & Plan Note (Signed)
Encouraged increased hydration and fiber in diet. Daily probiotics. If bowels not moving can use MOM 2 tbls po in 4 oz of warm prune juice by mouth every 2-3 days. If no results then repeat in 4 hours with  Dulcolax suppository pr, may repeat again in 4 more hours as needed. Seek care if symptoms worsen. Consider daily Miralax and/or Dulcolax if symptoms persist.  

## 2014-02-08 NOTE — Assessment & Plan Note (Signed)
Encouraged moist heat and gentle stretching as tolerated. May try NSAIDs and prescription meds as directed and report if symptoms worsen or seek immediate care 

## 2014-02-08 NOTE — Assessment & Plan Note (Signed)
Patient encouraged to maintain heart healthy diet, regular exercise, adequate sleep. Consider daily probiotics. Take medications as prescribed. Took flu shot in October

## 2014-02-08 NOTE — Assessment & Plan Note (Signed)
On right, questionable lesion, likely fibrocystic. Will proceed with MGM.

## 2014-02-12 ENCOUNTER — Other Ambulatory Visit: Payer: Self-pay | Admitting: Family Medicine

## 2014-02-12 DIAGNOSIS — N649 Disorder of breast, unspecified: Secondary | ICD-10-CM

## 2014-02-25 ENCOUNTER — Ambulatory Visit
Admission: RE | Admit: 2014-02-25 | Discharge: 2014-02-25 | Disposition: A | Discharge status: Home / Self Care | Payer: BC Managed Care – PPO | Source: Ambulatory Visit | Attending: Family Medicine | Admitting: Family Medicine

## 2014-02-25 DIAGNOSIS — N649 Disorder of breast, unspecified: Secondary | ICD-10-CM

## 2014-05-13 ENCOUNTER — Ambulatory Visit (INDEPENDENT_AMBULATORY_CARE_PROVIDER_SITE_OTHER): Payer: BLUE CROSS/BLUE SHIELD | Admitting: Physician Assistant

## 2014-05-13 ENCOUNTER — Encounter: Payer: Self-pay | Admitting: Physician Assistant

## 2014-05-13 VITALS — BP 101/54 | HR 64 | Temp 98.6°F | Resp 16 | Ht <= 58 in | Wt 105.1 lb

## 2014-05-13 DIAGNOSIS — R21 Rash and other nonspecific skin eruption: Secondary | ICD-10-CM

## 2014-05-13 MED ORDER — CLOTRIMAZOLE-BETAMETHASONE 1-0.05 % EX CREA: TOPICAL_CREAM | CUTANEOUS | Status: DC

## 2014-05-13 NOTE — Assessment & Plan Note (Signed)
Stable. Suspect fungal rash, possibly eczema. RX for Lortisone cream to apply twice daily. Follow up as needed.

## 2014-05-13 NOTE — Progress Notes (Signed)
Pre visit review using our clinic review tool, if applicable. No additional management support is needed unless otherwise documented below in the visit note/SLS  

## 2014-05-13 NOTE — Patient Instructions (Addendum)
Use Eucerin, aquaphor, or cetaphil to keep skin moisturized. Apply Clotrimozole cream to left breast twice daily for four weeks for rash. Follow up as needed. Have a good day!

## 2014-05-13 NOTE — Progress Notes (Signed)
   Subjective:    Patient ID: Heather Mann, female    DOB: June 14, 1980, 34 y.o.   MRN: [id number]  HPI Heather Mann is a 34 year old female who presents today with a chief complaint of itching to chest wall with itching worse to distal left breast. This has been present for greater than 2 months and has not resolved despite using lotion. She denies changing soaps or laundry detergents, and has not aquired new clothes, she currently uses Dove body wash. She describes a dry, scaled rash just distal to the areola without open lesions or drainage. Nothing has relieved the rash/itching better and nothing makes worse. 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    Review of Systems  Constitutional: Negative for fever.  HENT: Negative for rhinorrhea and sore throat.   Respiratory: Negative for cough.   Cardiovascular: Negative for chest pain.  Genitourinary:       Reports cyclical breast pain that occurs prior to menstruation.   Skin: Positive for rash.       Itching to bilateral breasts, left is worse.  Hematological: Negative for adenopathy.       Past Medical History  Diagnosis Date  . Constipation 02/08/2014  . Preventative health care 02/08/2014    History   Social History  . Marital Status: Married    Spouse Name: N/A  . Number of Children: N/A  . Years of Education: N/A   Occupational History  . Not on file.   Social History Main Topics  . Smoking status: Never Smoker   . Smokeless tobacco: Never Used  . Alcohol Use: No  . Drug Use: No  . Sexual Activity:    Partners: Male   Other Topics Concern  . Not on file   Social History Narrative    Past Surgical History  Procedure Laterality Date  . Cesarean section  01-22-13    No family history on file.  Allergies  Allergen Reactions  . Oxycodone Hives    Current Outpatient Prescriptions  on File Prior to Visit  Medication Sig Dispense Refill  . Multiple Vitamins-Minerals (MULTIVITAMIN WITH MINERALS) tablet Take 1 tablet by mouth daily.     No current facility-administered medications on file prior to visit.    BP 101/54 mmHg  Pulse 64  Temp(Src) 98.6 F (37 C) (Oral)  Resp 16  Ht 4\' 10"  (1.473 m)  Wt 105 lb 2 oz (47.684 kg)  BMI 21.98 kg/m2  SpO2 100%  LMP 05/06/2014    Objective:   Physical Exam  Constitutional: She is oriented to person, place, and time. She appears well-developed.  HENT:  Head: Normocephalic.  Cardiovascular: Normal rate and regular rhythm.   Pulmonary/Chest: Effort normal and breath sounds normal.  Neurological: She is alert and oriented to person, place, and time.  Skin: Skin is warm and dry. Rash noted.  Small irregular, faintly dark rash with mild scaling which appears to be from scratching. No open wounds.  Psychiatric: She has a normal mood and affect.          Assessment & Plan:

## 2014-06-22 ENCOUNTER — Ambulatory Visit (HOSPITAL_BASED_OUTPATIENT_CLINIC_OR_DEPARTMENT_OTHER)
Admission: RE | Admit: 2014-06-22 | Discharge: 2014-06-22 | Disposition: A | Discharge status: Home / Self Care | Payer: BLUE CROSS/BLUE SHIELD | Source: Ambulatory Visit | Attending: Medical | Admitting: Medical

## 2014-06-22 ENCOUNTER — Ambulatory Visit (INDEPENDENT_AMBULATORY_CARE_PROVIDER_SITE_OTHER): Payer: BLUE CROSS/BLUE SHIELD | Admitting: Medical

## 2014-06-22 ENCOUNTER — Other Ambulatory Visit: Payer: Self-pay | Admitting: Medical

## 2014-06-22 ENCOUNTER — Encounter: Payer: Self-pay | Admitting: Medical

## 2014-06-22 VITALS — BP 103/65 | HR 65 | Temp 98.0°F | Ht <= 58 in | Wt 105.2 lb

## 2014-06-22 DIAGNOSIS — M546 Pain in thoracic spine: Secondary | ICD-10-CM | POA: Insufficient documentation

## 2014-06-22 DIAGNOSIS — M25519 Pain in unspecified shoulder: Secondary | ICD-10-CM | POA: Insufficient documentation

## 2014-06-22 DIAGNOSIS — M542 Cervicalgia: Secondary | ICD-10-CM

## 2014-06-22 DIAGNOSIS — H1010 Acute atopic conjunctivitis, unspecified eye: Secondary | ICD-10-CM | POA: Insufficient documentation

## 2014-06-22 DIAGNOSIS — M549 Dorsalgia, unspecified: Secondary | ICD-10-CM | POA: Insufficient documentation

## 2014-06-22 DIAGNOSIS — M25512 Pain in left shoulder: Secondary | ICD-10-CM

## 2014-06-22 DIAGNOSIS — J309 Allergic rhinitis, unspecified: Secondary | ICD-10-CM

## 2014-06-22 MED ORDER — OLOPATADINE HCL 0.1 % OP SOLN: 1.0000 [drp] | Freq: Two times a day (BID) | OPHTHALMIC | Status: DC

## 2014-06-22 MED ORDER — LORATADINE 10 MG PO TABS: 10.0000 mg | ORAL_TABLET | Freq: Every day | ORAL | Status: DC

## 2014-06-22 MED ORDER — DICLOFENAC SODIUM 75 MG PO TBEC: 75.0000 mg | DELAYED_RELEASE_TABLET | Freq: Two times a day (BID) | ORAL | Status: DC

## 2014-06-22 MED ORDER — TIZANIDINE HCL 6 MG PO CAPS: ORAL_CAPSULE | ORAL | Status: DC

## 2014-06-22 NOTE — Assessment & Plan Note (Signed)
Rx claritin and patanol. If eye symptoms persist then consider eye doctor referral.

## 2014-06-22 NOTE — Patient Instructions (Signed)
Neck pain rx diclofenac  and zanaflex.  Xrays of lt shoulder, tspine and cspine.   Allergic conjunctivitis and rhinitis Rx claritin and patanol. If eye symptoms persist then consider eye doctor referral.    Follow up on Friday or as needed.

## 2014-06-22 NOTE — Progress Notes (Signed)
Pre visit review using our clinic review tool, if applicable. No additional management support is needed unless otherwise documented below in the visit note. 
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
## 2014-06-22 NOTE — Assessment & Plan Note (Addendum)
rx diclofenac  and zanaflex.  Xrays of lt shoulder, tspine and cspine.

## 2014-06-26 ENCOUNTER — Encounter: Payer: Self-pay | Admitting: Medical

## 2014-06-26 ENCOUNTER — Ambulatory Visit (INDEPENDENT_AMBULATORY_CARE_PROVIDER_SITE_OTHER): Payer: BLUE CROSS/BLUE SHIELD | Admitting: Medical

## 2014-06-26 VITALS — BP 104/67 | HR 69 | Temp 98.3°F | Ht <= 58 in | Wt 106.6 lb

## 2014-06-26 DIAGNOSIS — M25512 Pain in left shoulder: Secondary | ICD-10-CM | POA: Diagnosis not present

## 2014-06-26 DIAGNOSIS — M546 Pain in thoracic spine: Secondary | ICD-10-CM | POA: Diagnosis not present

## 2014-06-26 DIAGNOSIS — M542 Cervicalgia: Secondary | ICD-10-CM | POA: Diagnosis not present

## 2014-06-26 MED ORDER — TIZANIDINE HCL 6 MG PO CAPS: ORAL_CAPSULE | ORAL | Status: DC

## 2014-06-26 NOTE — Progress Notes (Signed)
   Subjective:    Patient ID: Heather Mann, female    DOB: [birth year], 34 y.o.   MRN: [id number]  HPI  Pt feels better. She feels little sore. Pt feels most sore in tspine and lt shoulder area. Only mild/faint sore in neck now.  Pt doing well with meds. She wants to return to work tomorrow. Also wants to try PT to recover completley and faster.  Xrays were negative for fractures.    Review of Systems  Constitutional: Negative for fever, chills and fatigue.  Respiratory: Negative for cough, shortness of breath and wheezing.   Cardiovascular: Negative for chest pain and palpitations.  Musculoskeletal:       Tspine area worse pain but a lot improved from before. 3 level pain at most.  Lt trapezius faint pain. Lt shoulder- mild pain.  Neurological: Negative for tremors, weakness, numbness and headaches.  Hematological: Negative for adenopathy. Does not bruise/bleed easily.  Psychiatric/Behavioral: Negative for confusion.    May 27, 2014.(Advised if menses does not come check if pregnant). Since will be on meds.   Past Medical History  Diagnosis Date  . Constipation 02/08/2014  . Preventative health care 02/08/2014    History   Social History  . Marital Status: Married    Spouse Name: N/A  . Number of Children: N/A  . Years of Education: N/A   Occupational History  . Not on file.   Social History Main Topics  . Smoking status: Never Smoker   . Smokeless tobacco: Never Used  . Alcohol Use: No  . Drug Use: No  . Sexual Activity:    Partners: Male   Other Topics Concern  . Not on file   Social History Narrative    Past Surgical History  Procedure Laterality Date  . Cesarean section  01-22-13    No family history on file.  Allergies  Allergen Reactions  . Oxycodone Hives    Current Outpatient Prescriptions on File Prior to Visit  Medication Sig Dispense Refill  . clotrimazole-betamethasone (LOTRISONE) cream Apply to left breast twice daily for four  weeks for itching and rash. 30 g 0  . diclofenac (VOLTAREN) 75 MG EC tablet Take 1 tablet (75 mg total) by mouth 2 (two) times daily. 30 tablet 0  . loratadine (CLARITIN) 10 MG tablet Take 1 tablet (10 mg total) by mouth daily. 30 tablet 0  . Multiple Vitamins-Minerals (MULTIVITAMIN WITH MINERALS) tablet Take 1 tablet by mouth daily.    Marland Kitchen olopatadine (PATANOL) 0.1 % ophthalmic solution Place 1 drop into both eyes 2 (two) times daily. 5 mL 1  . tizanidine (ZANAFLEX) 6 MG capsule 1 tab po q hs 10 capsule 0   No current facility-administered medications on file prior to visit.    BP 104/67 mmHg  Pulse 69  Temp(Src) 98.3 F (36.8 C) (Oral)  Ht 4\' 10"  (1.473 m)  Wt 106 lb 9.6 oz (48.353 kg)  BMI 22.29 kg/m2  SpO2 100%  LMP 05/18/2014        Objective:   Physical Exam  General- No acute distress. Pleasant patient. Neck- Full range of motion, No mid cspine pain. Lt trapezius mild tender. Lungs- Clear, even and unlabored. Heart- regular rate and rhythm. Neurologic- CNII- XII grossly intact. Lt shoulder- mild faint pain on rom.  T spine- moderate mid spine and faint paraspinal pain on palpation.       Assessment & Plan:

## 2014-06-26 NOTE — Assessment & Plan Note (Signed)
Same tx for other conditions. Refer to PT.

## 2014-06-26 NOTE — Assessment & Plan Note (Signed)
Better but still some. No fracture. Will refer to PT and return to work at her request. But advise if can't tolerate  due to pain then not to work and notify us on Monday. Can write her out more day.

## 2014-06-26 NOTE — Patient Instructions (Addendum)
Neck pain Much improved. Will refill prior meds and refer to PT at her request. Also return to work at her request.   Back pain Better but still some. No fracture. Will refer to PT and return to work at her request. But advise if can't tolerate  due to pain then not to work and notify us on Monday. Can write her out more day.    Pain in joint, shoulder region Same tx for other conditions. Refer to PT.    Follow up in 2 wks or as needed.  Noet later her work or pt wanted me to fill out paperwork for work. I got it late Friday and busy with patients. I could not fill out paper. Soonest I estimate could be Monday. May be later.

## 2014-06-26 NOTE — Progress Notes (Signed)
Pre visit review using our clinic review tool, if applicable. No additional management support is needed unless otherwise documented below in the visit note. 

## 2014-06-26 NOTE — Assessment & Plan Note (Signed)
Much improved. Will refill prior meds and refer to PT at her request. Also return to work at her request.

## 2014-06-30 ENCOUNTER — Ambulatory Visit (INDEPENDENT_AMBULATORY_CARE_PROVIDER_SITE_OTHER): Payer: BLUE CROSS/BLUE SHIELD | Admitting: Medical

## 2014-06-30 ENCOUNTER — Encounter: Payer: Self-pay | Admitting: Medical

## 2014-06-30 VITALS — BP 113/72 | HR 66 | Temp 97.9°F | Ht <= 58 in | Wt 104.8 lb

## 2014-06-30 DIAGNOSIS — M546 Pain in thoracic spine: Secondary | ICD-10-CM

## 2014-06-30 NOTE — Patient Instructions (Addendum)
Back pain Same as before as well as neck pain and shoulder pain. Discussed paper work I filled out. Will give 10 pound restriction on lifting. Paper work completed today. Same plan as before. PT will be scheduled with [facility].     Paperwork placed on staff members desk to fax forms to [facility].   Follow up here 2 wks or as needed.

## 2014-06-30 NOTE — Assessment & Plan Note (Addendum)
Same as before as well as neck pain and shoulder pain. Discussed paper work I filled out. Will give 10 pound restriction on lifting. Paper work completed today. Same plan as before. PT will be scheduled with [facility].

## 2014-06-30 NOTE — Progress Notes (Signed)
Pre visit review using our clinic review tool, if applicable. No additional management support is needed unless otherwise documented below in the visit note. 

## 2014-06-30 NOTE — Progress Notes (Signed)
   Subjective:    Patient ID: Sumire Halbleib, female    DOB: 20-Mar-1980, 34 y.o.   MRN: [id number]  HPI  Pt in to discuss review of paperwork.Pt went back to work full time and no restrictions. Pt today she wants limit of 10 pounds. She is same as before on review. This is limited visit to discuss paperwork which I got on Friday and they wanted paperwork filled out stat. I filled out paperwork this week. But will perform physical as well.  Review of Systems  Musculoskeletal:       Virtually same as before.    Past Medical History  Diagnosis Date  . Constipation 02/08/2014  . Preventative health care 02/08/2014    History   Social History  . Marital Status: Married    Spouse Name: N/A  . Number of Children: N/A  . Years of Education: N/A   Occupational History  . Not on file.   Social History Main Topics  . Smoking status: Never Smoker   . Smokeless tobacco: Never Used  . Alcohol Use: No  . Drug Use: No  . Sexual Activity:    Partners: Male   Other Topics Concern  . Not on file   Social History Narrative    Past Surgical History  Procedure Laterality Date  . Cesarean section  01-22-13    No family history on file.  Allergies  Allergen Reactions  . Oxycodone Hives    Current Outpatient Prescriptions on File Prior to Visit  Medication Sig Dispense Refill  . clotrimazole-betamethasone (LOTRISONE) cream Apply to left breast twice daily for four weeks for itching and rash. 30 g 0  . diclofenac (VOLTAREN) 75 MG EC tablet Take 1 tablet (75 mg total) by mouth 2 (two) times daily. 30 tablet 0  . loratadine (CLARITIN) 10 MG tablet Take 1 tablet (10 mg total) by mouth daily. 30 tablet 0  . Multiple Vitamins-Minerals (MULTIVITAMIN WITH MINERALS) tablet Take 1 tablet by mouth daily.    Marland Kitchen olopatadine (PATANOL) 0.1 % ophthalmic solution Place 1 drop into both eyes 2 (two) times daily. 5 mL 1  . tizanidine (ZANAFLEX) 6 MG capsule 1 tab po q hs 10 capsule 0   No current  facility-administered medications on file prior to visit.    BP 113/72 mmHg  Pulse 66  Temp(Src) 97.9 F (36.6 C) (Oral)  Ht 4\' 10"  (1.473 m)  Wt 104 lb 12.8 oz (47.537 kg)  BMI 21.91 kg/m2  SpO2 100%  LMP 06/29/2014      Objective:   Physical Exam  Physical Exam  General- No acute distress. Pleasant patient. Neck- Full range of motion, No mid cspine pain. Lt trapezius mild tender. Lungs- Clear, even and unlabored. Heart- regular rate and rhythm. Neurologic- CNII- XII grossly intact. Lt shoulder- mild faint pain on rom.  T spine- moderate mid spine and faint paraspinal pain on palpation              Assessment & Plan:

## 2014-07-10 ENCOUNTER — Ambulatory Visit: Payer: BLUE CROSS/BLUE SHIELD | Admitting: Medical

## 2014-07-14 ENCOUNTER — Ambulatory Visit: Payer: BLUE CROSS/BLUE SHIELD | Admitting: Physician Assistant

## 2014-07-15 ENCOUNTER — Encounter: Payer: Self-pay | Admitting: Physician Assistant

## 2014-07-15 ENCOUNTER — Ambulatory Visit (INDEPENDENT_AMBULATORY_CARE_PROVIDER_SITE_OTHER): Payer: BLUE CROSS/BLUE SHIELD | Admitting: Physician Assistant

## 2014-07-15 VITALS — BP 99/52 | HR 68 | Temp 98.0°F | Wt 103.0 lb

## 2014-07-15 DIAGNOSIS — M62830 Muscle spasm of back: Secondary | ICD-10-CM | POA: Diagnosis not present

## 2014-07-15 MED ORDER — CYCLOBENZAPRINE HCL 10 MG PO TABS: 10.0000 mg | ORAL_TABLET | Freq: Every day | ORAL | Status: DC

## 2014-07-15 MED ORDER — CYCLOBENZAPRINE HCL 10 MG PO TABS: 10.0000 mg | ORAL_TABLET | Freq: Three times a day (TID) | ORAL | Status: DC | PRN

## 2014-07-15 NOTE — Patient Instructions (Signed)
Please take Flexeril at bedtime.  Apply Aspercreme to the back. Avoid heavy lifting. Continue visits with the chiropractor. A massage would be beneficial.  If symptoms are not improving, we will need to get a Sports Medicine doctor involved.

## 2014-07-15 NOTE — Assessment & Plan Note (Signed)
Rx Flexeril at bedtime. Moist heat to area. 2 Aleve each morning over next week. Avoid heavy lifting -- continue work restrictions. Massage may be beneficial. Recommended Sports Medicine evaluation if not improving as patient refuses therapy.

## 2014-07-15 NOTE — Progress Notes (Signed)
Pre visit review using our clinic review tool, if applicable. No additional management support is needed unless otherwise documented below in the visit note. 

## 2014-07-15 NOTE — Progress Notes (Signed)
   Patient presents to clinic today for 2 week follow-up of midline thoracic back pain. This is this providers first time seeing patient for this issue. Was previously seen by another provider in office.  X-rays from 06/22/14 (left shoulder, thoracic and cervical spine) reviewed.  Only abnormality was some lack of lordosis of cervical spine, correlating with muscle spasms.  Patient was started on Voltaren tablets.  Restricted to no more than 10 pounds of weight to lift.  Also set up with physical therapy but did not like the medication.  Has not been taking Voltaren as she does not like "pain pills".  Past Medical History  Diagnosis Date  . Constipation 02/08/2014  . Preventative health care 02/08/2014    Current Outpatient Prescriptions on File Prior to Visit  Medication Sig Dispense Refill  . loratadine (CLARITIN) 10 MG tablet Take 1 tablet (10 mg total) by mouth daily. 30 tablet 0  . Multiple Vitamins-Minerals (MULTIVITAMIN WITH MINERALS) tablet Take 1 tablet by mouth daily.    Marland Kitchen olopatadine (PATANOL) 0.1 % ophthalmic solution Place 1 drop into both eyes 2 (two) times daily. 5 mL 1   No current facility-administered medications on file prior to visit.    Allergies  Allergen Reactions  . Oxycodone Hives    No family history on file.  History   Social History  . Marital Status: Married    Spouse Name: N/A  . Number of Children: N/A  . Years of Education: N/A   Social History Main Topics  . Smoking status: Never Smoker   . Smokeless tobacco: Never Used  . Alcohol Use: No  . Drug Use: No  . Sexual Activity:    Partners: Male   Other Topics Concern  . None   Social History Narrative   Review of Systems - See HPI.  All other ROS are negative.  BP 99/52 mmHg  Pulse 68  Temp(Src) 98 F (36.7 C)  Wt 103 lb (46.72 kg)  SpO2 100%  LMP 06/29/2014  Physical Exam  Constitutional: She is oriented to person, place, and time and well-developed, well-nourished, and in no  distress.  HENT:  Head: Normocephalic and atraumatic.  Cardiovascular: Normal rate, regular rhythm, normal heart sounds and intact distal pulses.   Pulmonary/Chest: Effort normal and breath sounds normal. No respiratory distress. She has no wheezes. She has no rales. She exhibits no tenderness.  Musculoskeletal:       Thoracic back: She exhibits tenderness, pain and spasm. She exhibits normal range of motion, no bony tenderness and no swelling.  Neurological: She is alert and oriented to person, place, and time.  Skin: Skin is warm and dry. No rash noted.  Psychiatric: Affect normal.  Vitals reviewed.  Assessment/Plan: Muscle spasm of back Rx Flexeril at bedtime. Moist heat to area. 2 Aleve each morning over next week. Avoid heavy lifting -- continue work restrictions. Massage may be beneficial. Recommended Sports Medicine evaluation if not improving as patient refuses therapy.

## 2014-07-24 ENCOUNTER — Other Ambulatory Visit: Payer: Self-pay | Admitting: Family Medicine

## 2014-07-24 DIAGNOSIS — D242 Benign neoplasm of left breast: Secondary | ICD-10-CM

## 2014-07-29 ENCOUNTER — Ambulatory Visit
Admission: RE | Admit: 2014-07-29 | Discharge: 2014-07-29 | Disposition: A | Discharge status: Home / Self Care | Payer: BLUE CROSS/BLUE SHIELD | Source: Ambulatory Visit | Attending: Family Medicine | Admitting: Family Medicine

## 2014-07-29 DIAGNOSIS — D242 Benign neoplasm of left breast: Secondary | ICD-10-CM

## 2014-10-05 ENCOUNTER — Telehealth: Payer: Self-pay | Admitting: Family Medicine

## 2014-10-05 NOTE — Telephone Encounter (Signed)
Caller name: Oluwasemilore Pascuzzi Relationship to patient: husband Can be reached: [phone]  Reason for call: Pt husband did not request I send a msg. Enter notes because he states pt has chest pains radiating into her back. He refused transfer to nurse or [facility]. He refused appt with other provider. Only wants morning appointment with Dr. Charlett Blake. Next available morning appt 11/10/14. He said to schedule it and he'll go to ER if he needs to. He was very frustrated stating that any time they call we offer appt with other provicers and pt cannot see her doctor.

## 2014-10-05 NOTE — Telephone Encounter (Signed)
FYI- Spoke to patient who stated that the pain has been going on for over one month and she has been putting cream on it.  She states that it hurts worse when she takes a deep breath.  Offered her sooner appointment with provider but she refused, stating that she could no go anywhere today and would have to talk to her husband and call back.

## 2014-10-05 NOTE — Telephone Encounter (Signed)
If she can meet me here at 7:30 I could come in early on Thursday or Friday and see her.

## 2014-10-06 NOTE — Telephone Encounter (Signed)
FYI - Notified husband, Shanon Brow, and he stated he would ask patient when she is home from work (thinks she is off on Friday).  He will call back to the office when she answers.   (see below- OK with Dr. B to do 7:30 on Friday)

## 2014-10-08 NOTE — Telephone Encounter (Signed)
Tried to call pt to see if she wants to come in but phone did not go thru

## 2014-10-09 ENCOUNTER — Ambulatory Visit (HOSPITAL_BASED_OUTPATIENT_CLINIC_OR_DEPARTMENT_OTHER)
Admission: RE | Admit: 2014-10-09 | Discharge: 2014-10-09 | Disposition: A | Discharge status: Home / Self Care | Payer: BLUE CROSS/BLUE SHIELD | Source: Ambulatory Visit | Attending: Family Medicine | Admitting: Family Medicine

## 2014-10-09 ENCOUNTER — Ambulatory Visit (INDEPENDENT_AMBULATORY_CARE_PROVIDER_SITE_OTHER): Payer: BLUE CROSS/BLUE SHIELD | Admitting: Family Medicine

## 2014-10-09 VITALS — BP 96/64 | HR 67 | Temp 98.3°F | Resp 16 | Wt 103.0 lb

## 2014-10-09 DIAGNOSIS — R0789 Other chest pain: Secondary | ICD-10-CM

## 2014-10-09 DIAGNOSIS — M549 Dorsalgia, unspecified: Secondary | ICD-10-CM

## 2014-10-09 DIAGNOSIS — K5900 Constipation, unspecified: Secondary | ICD-10-CM

## 2014-10-09 DIAGNOSIS — R0981 Nasal congestion: Secondary | ICD-10-CM

## 2014-10-09 DIAGNOSIS — D508 Other iron deficiency anemias: Secondary | ICD-10-CM

## 2014-10-09 DIAGNOSIS — R1013 Epigastric pain: Secondary | ICD-10-CM

## 2014-10-09 DIAGNOSIS — R079 Chest pain, unspecified: Secondary | ICD-10-CM | POA: Diagnosis not present

## 2014-10-09 LAB — COMPREHENSIVE METABOLIC PANEL
ALK PHOS: 64 U/L (ref 39–117)
ALT: 23 U/L (ref 0–35)
AST: 23 U/L (ref 0–37)
Albumin: 4.4 g/dL (ref 3.5–5.2)
BUN: 14 mg/dL (ref 6–23)
CO2: 29 mEq/L (ref 19–32)
CREATININE: 0.65 mg/dL (ref 0.40–1.20)
Calcium: 9.5 mg/dL (ref 8.4–10.5)
Chloride: 103 mEq/L (ref 96–112)
GFR: 111.04 mL/min (ref >60.00)
GLUCOSE: 73 mg/dL (ref 70–99)
POTASSIUM: 3.5 meq/L (ref 3.5–5.1)
SODIUM: 138 meq/L (ref 135–145)
TOTAL PROTEIN: 7.5 g/dL (ref 6.0–8.3)
Total Bilirubin: 0.8 mg/dL (ref 0.2–1.2)

## 2014-10-09 LAB — CBC
HCT: 38.9 % (ref 36.0–46.0)
HEMOGLOBIN: 13.3 g/dL (ref 12.0–15.0)
MCHC: 34.3 g/dL (ref 30.0–36.0)
MCV: 94.6 fl (ref 78.0–100.0)
Platelets: 241 10*3/uL (ref 150.0–400.0)
RBC: 4.11 Mil/uL (ref 3.87–5.11)
RDW: 13 % (ref 11.5–15.5)
WBC: 5.3 10*3/uL (ref 4.0–10.5)

## 2014-10-09 LAB — SEDIMENTATION RATE: Sed Rate: 16 mm/hr (ref 0–22)

## 2014-10-09 MED ORDER — METHOCARBAMOL 500 MG PO TABS: 500.0000 mg | ORAL_TABLET | Freq: Three times a day (TID) | ORAL | Status: DC | PRN

## 2014-10-09 NOTE — Progress Notes (Signed)
Pre visit review using our clinic review tool, if applicable. No additional management support is needed unless otherwise documented below in the visit note. 

## 2014-10-09 NOTE — Assessment & Plan Note (Signed)
Repeat cbc today 

## 2014-10-09 NOTE — Assessment & Plan Note (Signed)
Pain today c/w musculoskeletal nature. Pain to palp noted. Given robaxin and cxr and ekg done today for reassurance

## 2014-10-09 NOTE — Patient Instructions (Signed)
[place] patches and    Back Pain, Adult Low back pain is very common. About 1 in 5 people have back pain.The cause of low back pain is rarely dangerous. The pain often gets better over time.About half of people with a sudden onset of back pain feel better in just 2 weeks. About 8 in 10 people feel better by 6 weeks.  CAUSES Some common causes of back pain include:  Strain of the muscles or ligaments supporting the spine.  Wear and tear (degeneration) of the spinal discs.  Arthritis.  Direct injury to the back. DIAGNOSIS Most of the time, the direct cause of low back pain is not known.However, back pain can be treated effectively even when the exact cause of the pain is unknown.Answering your caregiver's questions about your overall health and symptoms is one of the most accurate ways to make sure the cause of your pain is not dangerous. If your caregiver needs more information, he or she may order lab work or imaging tests (X-rays or MRIs).However, even if imaging tests show changes in your back, this usually does not require surgery. HOME CARE INSTRUCTIONS For many people, back pain returns.Since low back pain is rarely dangerous, it is often a condition that people can learn to [facility] their own.   Remain active. It is stressful on the back to sit or stand in one place. Do not sit, drive, or stand in one place for more than 30 minutes at a time. Take short walks on level surfaces as soon as pain allows.Try to increase the length of time you walk each day.  Do not stay in bed.Resting more than 1 or 2 days can delay your recovery.  Do not avoid exercise or work.Your body is made to move.It is not dangerous to be active, even though your back may hurt.Your back will likely heal faster if you return to being active before your pain is gone.  Pay attention to your body when you bend and lift. Many people have less discomfortwhen lifting if they bend their knees, keep the load  close to their bodies,and avoid twisting. Often, the most comfortable positions are those that put less stress on your recovering back.  Find a comfortable position to sleep. Use a firm mattress and lie on your side with your knees slightly bent. If you lie on your back, put a pillow under your knees.  Only take over-the-counter or prescription medicines as directed by your caregiver. Over-the-counter medicines to reduce pain and inflammation are often the most helpful.Your caregiver may prescribe muscle relaxant drugs.These medicines help dull your pain so you can more quickly return to your normal activities and healthy exercise.  Put ice on the injured area.  Put ice in a plastic bag.  Place a towel between your skin and the bag.  Leave the ice on for 15-20 minutes, 03-04 times a day for the first 2 to 3 days. After that, ice and heat may be alternated to reduce pain and spasms.  Ask your caregiver about trying back exercises and gentle massage. This may be of some benefit.  Avoid feeling anxious or stressed.Stress increases muscle tension and can worsen back pain.It is important to recognize when you are anxious or stressed and learn ways to manage it.Exercise is a great option. SEEK MEDICAL CARE IF:  You have pain that is not relieved with rest or medicine.  You have pain that does not improve in 1 week.  You have new symptoms.  You are generally not feeling well. SEEK IMMEDIATE MEDICAL CARE IF:   You have pain that radiates from your back into your legs.  You develop new bowel or bladder control problems.  You have unusual weakness or numbness in your arms or legs.  You develop nausea or vomiting.  You develop abdominal pain.  You feel faint. Document Released: 02/27/2005 Document Revised: 08/29/2011 Document Reviewed: 07/01/2013 [facility] Patient Information 2015 [place], [place]. This information is not intended to replace advice given to you by your health care  provider. Make sure you discuss any questions you have with your health care provider. gels

## 2014-10-18 ENCOUNTER — Encounter: Payer: Self-pay | Admitting: Family Medicine

## 2014-10-18 DIAGNOSIS — R0789 Other chest pain: Secondary | ICD-10-CM

## 2014-10-18 HISTORY — DX: Other chest pain: R07.89

## 2014-10-18 NOTE — Progress Notes (Signed)
Heather Mann  [id number] 20-Jun-1980 10/18/2014      Progress Note-Follow Up  Subjective  Chief Complaint  Chief Complaint  Patient presents with  . Chest Pain    in center under sternum and to the right, intermittent shortness of breath since daughter was born     HPI  Patient is a 34 y.o. female in today for routine medical care. patient is in today accompanied by her husband both upper and lower back, no radicular symptoms. She also has some chest wall pain which is worse with movement and use. No shortness of breath or palpitations. She acknowledges feeling anxious when the pain occurs. Denies CP/palp/SOB/HA/congestion/fevers/GI or GU c/o. Taking meds as prescribed  Past Medical History  Diagnosis Date  . Constipation 02/08/2014  . Preventative health care 02/08/2014  . Atypical chest pain 10/18/2014    Past Surgical History  Procedure Laterality Date  . Cesarean section  01-22-13    History reviewed. No pertinent family history.  History   Social History  . Marital Status: Married    Spouse Name: N/A  . Number of Children: N/A  . Years of Education: N/A   Occupational History  . Not on file.   Social History Main Topics  . Smoking status: Never Smoker   . Smokeless tobacco: Never Used  . Alcohol Use: No  . Drug Use: No  . Sexual Activity:    Partners: Male   Other Topics Concern  . Not on file   Social History Narrative    Current Outpatient Prescriptions on File Prior to Visit  Medication Sig Dispense Refill  . Multiple Vitamins-Minerals (MULTIVITAMIN WITH MINERALS) tablet Take 1 tablet by mouth daily.    Marland Kitchen olopatadine (PATANOL) 0.1 % ophthalmic solution Place 1 drop into both eyes 2 (two) times daily. 5 mL 1  . cyclobenzaprine (FLEXERIL) 10 MG tablet Take 1 tablet (10 mg total) by mouth at bedtime. (Patient not taking: Reported on 10/09/2014) 30 tablet 0  . loratadine (CLARITIN) 10 MG tablet Take 1 tablet (10 mg total) by mouth daily. (Patient not  taking: Reported on 10/09/2014) 30 tablet 0   No current facility-administered medications on file prior to visit.    Allergies  Allergen Reactions  . Oxycodone Hives    Review of Systems  Review of Systems  Constitutional: Negative for fever and malaise/fatigue.  HENT: Negative for congestion.   Eyes: Negative for discharge.  Respiratory: Negative for shortness of breath.   Cardiovascular: Positive for chest pain. Negative for palpitations and leg swelling.  Gastrointestinal: Negative for nausea, abdominal pain and diarrhea.  Genitourinary: Negative for dysuria.  Musculoskeletal: Positive for back pain. Negative for falls.  Skin: Negative for rash.  Neurological: Negative for loss of consciousness and headaches.  Endo/Heme/Allergies: Negative for polydipsia.  Psychiatric/Behavioral: Negative for depression and suicidal ideas. The patient is nervous/anxious. The patient does not have insomnia.     Objective  BP 96/64 mmHg  Pulse 67  Temp(Src) 98.3 F (36.8 C) (Oral)  Resp 16  Wt 103 lb (46.72 kg)  SpO2 98%  LMP 09/06/2014  Physical Exam  Physical Exam  Constitutional: She is oriented to person, place, and time and well-developed, well-nourished, and in no distress. No distress.  HENT:  Head: Normocephalic and atraumatic.  Eyes: Conjunctivae are normal.  Neck: Neck supple. No thyromegaly present.  Cardiovascular: Normal rate, regular rhythm and normal heart sounds.   No murmur heard. Pulmonary/Chest: Effort normal and breath sounds normal. She has no wheezes.  Abdominal: She exhibits no distension and no mass.  Musculoskeletal: She exhibits no edema.  Lymphadenopathy:    She has no cervical adenopathy.  Neurological: She is alert and oriented to person, place, and time.  Skin: Skin is warm and dry. No rash noted. She is not diaphoretic.  Psychiatric: Memory, affect and judgment normal.    Lab Results  Component Value Date   TSH 1.58 02/02/2014   Lab Results   Component Value Date   WBC 5.3 10/09/2014   HGB 13.3 10/09/2014   HCT 38.9 10/09/2014   MCV 94.6 10/09/2014   PLT 241.0 10/09/2014   Lab Results  Component Value Date   CREATININE 0.65 10/09/2014   BUN 14 10/09/2014   NA 138 10/09/2014   K 3.5 10/09/2014   CL 103 10/09/2014   CO2 29 10/09/2014   Lab Results  Component Value Date   ALT 23 10/09/2014   AST 23 10/09/2014   ALKPHOS 64 10/09/2014   BILITOT 0.8 10/09/2014   Lab Results  Component Value Date   CHOL 194 02/02/2014   Lab Results  Component Value Date   HDL 65.70 02/02/2014   Lab Results  Component Value Date   LDLCALC 117* 02/02/2014   Lab Results  Component Value Date   TRIG 58.0 02/02/2014   Lab Results  Component Value Date   CHOLHDL 3 02/02/2014     Assessment & Plan  Anemia Repeat cbc today  Back pain Pain today c/w musculoskeletal nature. Pain to palp noted. Given robaxin and cxr and ekg done today for reassurance  Atypical chest pain EKG and chest pain are normal. Likely musculoskeletal. Encouraged topical treatments and Encouraged moist heat and gentle stretching as tolerated. May try NSAIDs and prescription meds as directed and report if symptoms worsen or seek immediate care  Constipation Encouraged increased hydration and fiber in diet. Daily probiotics. If bowels not moving can use MOM 2 tbls po in 4 oz of warm prune juice by mouth every 2-3 days. If no results then repeat in 4 hours with  Dulcolax suppository pr, may repeat again in 4 more hours as needed.

## 2014-10-18 NOTE — Assessment & Plan Note (Signed)
EKG and chest pain are normal. Likely musculoskeletal. Encouraged topical treatments and Encouraged moist heat and gentle stretching as tolerated. May try NSAIDs and prescription meds as directed and report if symptoms worsen or seek immediate care

## 2014-10-18 NOTE — Assessment & Plan Note (Signed)
Encouraged increased hydration and fiber in diet. Daily probiotics. If bowels not moving can use MOM 2 tbls po in 4 oz of warm prune juice by mouth every 2-3 days. If no results then repeat in 4 hours with  Dulcolax suppository pr, may repeat again in 4 more hours as needed.  

## 2014-11-10 ENCOUNTER — Ambulatory Visit: Payer: BLUE CROSS/BLUE SHIELD | Admitting: Family Medicine

## 2014-11-17 ENCOUNTER — Encounter: Payer: Self-pay | Admitting: Physician Assistant

## 2014-11-17 ENCOUNTER — Ambulatory Visit (INDEPENDENT_AMBULATORY_CARE_PROVIDER_SITE_OTHER): Payer: BLUE CROSS/BLUE SHIELD | Admitting: Physician Assistant

## 2014-11-17 DIAGNOSIS — S0990XD Unspecified injury of head, subsequent encounter: Secondary | ICD-10-CM | POA: Diagnosis not present

## 2014-11-17 DIAGNOSIS — S0990XA Unspecified injury of head, initial encounter: Secondary | ICD-10-CM | POA: Insufficient documentation

## 2014-11-17 MED ORDER — METHOCARBAMOL 750 MG PO TABS: 750.0000 mg | ORAL_TABLET | Freq: Every evening | ORAL | Status: DC | PRN

## 2014-11-17 MED ORDER — MELOXICAM 15 MG PO TABS: 15.0000 mg | ORAL_TABLET | Freq: Every day | ORAL | Status: DC

## 2014-11-17 NOTE — Patient Instructions (Signed)
Please keep your phone on as you will be contacted to schedule a CT scan. Take the Robaxin 1/2 tablet at bedtime. Stay hydrated and rest. Take Mobic once daily with food as needed for pain. Use Tylenol for breakthrough pain. Limit heavy lifting. I am taking you out of work until next Monday.   Return sooner if something if anything worsens.

## 2014-11-17 NOTE — Assessment & Plan Note (Signed)
Neuro exam unremarkable.Suspect post-concussion syndrome and muscle tension but giving head trauma and symptoms wtll obtain CT Head. Supportive measures reviewed. Rx Mobic and Robaxin. Follow-up will be based on results.
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
## 2014-11-17 NOTE — Assessment & Plan Note (Signed)
Rx Robaxin at bedtime. Mobic once daily with food. Avoid heavy lifting or overexertion. Symptoms should improve gradually. Patient written OOW until 11/23/14.

## 2014-11-19 ENCOUNTER — Ambulatory Visit (HOSPITAL_BASED_OUTPATIENT_CLINIC_OR_DEPARTMENT_OTHER)
Admission: RE | Admit: 2014-11-19 | Discharge: 2014-11-19 | Disposition: A | Discharge status: Home / Self Care | Payer: BLUE CROSS/BLUE SHIELD | Source: Ambulatory Visit | Attending: Physician Assistant | Admitting: Physician Assistant

## 2014-11-19 DIAGNOSIS — R200 Anesthesia of skin: Secondary | ICD-10-CM | POA: Diagnosis not present

## 2014-11-19 DIAGNOSIS — R51 Headache: Secondary | ICD-10-CM | POA: Diagnosis present

## 2014-11-19 DIAGNOSIS — S0990XD Unspecified injury of head, subsequent encounter: Secondary | ICD-10-CM

## 2014-11-23 ENCOUNTER — Encounter: Payer: Self-pay | Admitting: Physician Assistant

## 2014-11-23 ENCOUNTER — Ambulatory Visit (INDEPENDENT_AMBULATORY_CARE_PROVIDER_SITE_OTHER): Payer: BLUE CROSS/BLUE SHIELD | Admitting: Physician Assistant

## 2014-11-23 ENCOUNTER — Telehealth: Payer: Self-pay | Admitting: *Deleted

## 2014-11-23 DIAGNOSIS — H8149 Vertigo of central origin, unspecified ear: Secondary | ICD-10-CM | POA: Diagnosis not present

## 2014-11-23 DIAGNOSIS — Z0279 Encounter for issue of other medical certificate: Secondary | ICD-10-CM

## 2014-11-23 MED ORDER — TIZANIDINE HCL 2 MG PO CAPS: 2.0000 mg | ORAL_CAPSULE | Freq: Every evening | ORAL | Status: DC | PRN

## 2014-11-23 NOTE — Progress Notes (Signed)
Pre visit review using our clinic review tool, if applicable. No additional management support is needed unless otherwise documented below in the visit note/SLS  

## 2014-11-23 NOTE — Telephone Encounter (Signed)
Received Attending Physician Statement via fax from [place]. Filled out as much as possible, attached requested OV notes, and forwarded to Lee. JG//CMA

## 2014-11-23 NOTE — Patient Instructions (Signed)
Please take the Mobic as follows (Take 1/2 tablet twice daily). Stop the Robaxin and start the Zanaflex (Tizanidine) at bedtime. Limit heavy lifting.  You may return to work on Thursday 11/26/14. I will finish your paperwork and fax to your employer.
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
## 2014-11-23 NOTE — Assessment & Plan Note (Signed)
With post-concussion syndrome. Again CT negative. Discussed prognosis with patient and spouse. Increase Mobic to 7.5 mg BID. Dc Robaxin. Begin Zanaflex 2 mg at bedtime. FMLA paperwork filled out.

## 2014-11-24 NOTE — Telephone Encounter (Signed)
Completed forms along with OV notes faxed to [facility] successfully at [phone]. Originals placed up front for pt. Copy sent for scanning. JG//CMA

## 2015-02-03 ENCOUNTER — Telehealth: Payer: Self-pay | Admitting: Behavioral Health

## 2015-02-03 NOTE — Telephone Encounter (Signed)
Unable to reach patient at time of Pre-Visit Call.  Left message for patient to return call when available.    

## 2015-02-05 ENCOUNTER — Encounter: Payer: BC Managed Care – PPO | Admitting: Family Medicine

## 2015-02-08 ENCOUNTER — Encounter: Payer: Self-pay | Admitting: Family Medicine

## 2015-02-08 ENCOUNTER — Ambulatory Visit (INDEPENDENT_AMBULATORY_CARE_PROVIDER_SITE_OTHER): Payer: BLUE CROSS/BLUE SHIELD | Admitting: Family Medicine

## 2015-02-08 VITALS — BP 108/62 | HR 72 | Temp 98.3°F | Ht <= 58 in | Wt 104.1 lb

## 2015-02-08 DIAGNOSIS — Z23 Encounter for immunization: Secondary | ICD-10-CM

## 2015-02-08 DIAGNOSIS — R928 Other abnormal and inconclusive findings on diagnostic imaging of breast: Secondary | ICD-10-CM | POA: Diagnosis not present

## 2015-02-08 DIAGNOSIS — K5900 Constipation, unspecified: Secondary | ICD-10-CM

## 2015-02-08 DIAGNOSIS — M546 Pain in thoracic spine: Secondary | ICD-10-CM | POA: Diagnosis not present

## 2015-02-08 DIAGNOSIS — R0789 Other chest pain: Secondary | ICD-10-CM

## 2015-02-08 DIAGNOSIS — Z0001 Encounter for general adult medical examination with abnormal findings: Secondary | ICD-10-CM

## 2015-02-08 DIAGNOSIS — N920 Excessive and frequent menstruation with regular cycle: Secondary | ICD-10-CM

## 2015-02-08 DIAGNOSIS — E782 Mixed hyperlipidemia: Secondary | ICD-10-CM

## 2015-02-08 DIAGNOSIS — Z0000 Encounter for general adult medical examination without abnormal findings: Secondary | ICD-10-CM

## 2015-02-08 LAB — COMPREHENSIVE METABOLIC PANEL
ALT: 15 U/L (ref 0–35)
AST: 20 U/L (ref 0–37)
Albumin: 4.4 g/dL (ref 3.5–5.2)
Alkaline Phosphatase: 58 U/L (ref 39–117)
BUN: 16 mg/dL (ref 6–23)
CHLORIDE: 102 meq/L (ref 96–112)
CO2: 27 meq/L (ref 19–32)
CREATININE: 0.58 mg/dL (ref 0.40–1.20)
Calcium: 9.9 mg/dL (ref 8.4–10.5)
GFR: 126.4 mL/min (ref >60.00)
Glucose, Bld: 64 mg/dL — ABNORMAL LOW (ref 70–99)
POTASSIUM: 3.8 meq/L (ref 3.5–5.1)
SODIUM: 137 meq/L (ref 135–145)
Total Bilirubin: 0.6 mg/dL (ref 0.2–1.2)
Total Protein: 7.5 g/dL (ref 6.0–8.3)

## 2015-02-08 LAB — LIPID PANEL
Cholesterol: 179 mg/dL (ref 0–200)
HDL: 65.8 mg/dL (ref >39.00)
LDL CALC: 101 mg/dL — AB (ref 0–99)
NonHDL: 113.25
TRIGLYCERIDES: 61 mg/dL (ref 0.0–149.0)
Total CHOL/HDL Ratio: 3
VLDL: 12.2 mg/dL (ref 0.0–40.0)

## 2015-02-08 LAB — TSH: TSH: 1.8 u[IU]/mL (ref 0.35–4.50)

## 2015-02-08 LAB — CBC
HEMATOCRIT: 41.4 % (ref 36.0–46.0)
Hemoglobin: 13.9 g/dL (ref 12.0–15.0)
MCHC: 33.6 g/dL (ref 30.0–36.0)
MCV: 94.9 fl (ref 78.0–100.0)
Platelets: 242 10*3/uL (ref 150.0–400.0)
RBC: 4.36 Mil/uL (ref 3.87–5.11)
RDW: 13.1 % (ref 11.5–15.5)
WBC: 6.2 10*3/uL (ref 4.0–10.5)

## 2015-02-08 MED ORDER — DESOGESTREL-ETHINYL ESTRADIOL 0.15-0.02/0.01 MG (21/5) PO TABS: 1.0000 | ORAL_TABLET | Freq: Every day | ORAL | Status: DC

## 2015-02-08 NOTE — Assessment & Plan Note (Addendum)
Patient encouraged to maintain heart healthy diet, regular exercise, adequate sleep. Consider daily probiotics. Take medications as prescribed. Labs reviewed 

## 2015-02-08 NOTE — Progress Notes (Signed)
Pre visit review using our clinic review tool, if applicable. No additional management support is needed unless otherwise documented below in the visit note. 

## 2015-02-08 NOTE — Patient Instructions (Signed)
Preventive Care for Adults, Female A healthy lifestyle and preventive care can promote health and wellness. Preventive health guidelines for women include the following key practices.  A routine yearly physical is a good way to check with your health care provider about your health and preventive screening. It is a chance to share any concerns and updates on your health and to receive a thorough exam.  Visit your dentist for a routine exam and preventive care every 6 months. Brush your teeth twice a day and floss once a day. Good oral hygiene prevents tooth decay and gum disease.  The frequency of eye exams is based on your age, health, family medical history, use of contact lenses, and other factors. Follow your health care provider's recommendations for frequency of eye exams.  Eat a healthy diet. Foods like vegetables, fruits, whole grains, low-fat dairy products, and lean protein foods contain the nutrients you need without too many calories. Decrease your intake of foods high in solid fats, added sugars, and salt. Eat the right amount of calories for you.Get information about a proper diet from your health care provider, if necessary.  Regular physical exercise is one of the most important things you can do for your health. Most adults should get at least 150 minutes of moderate-intensity exercise (any activity that increases your heart rate and causes you to sweat) each week. In addition, most adults need muscle-strengthening exercises on 2 or more days a week.  Maintain a healthy weight. The body mass index (BMI) is a screening tool to identify possible weight problems. It provides an estimate of body fat based on height and weight. Your health care provider can find your BMI and can help you achieve or maintain a healthy weight.For adults 20 years and older:  A BMI below 18.5 is considered underweight.  A BMI of 18.5 to 24.9 is normal.  A BMI of 25 to 29.9 is considered overweight.  A  BMI of 30 and above is considered obese.  Maintain normal blood lipids and cholesterol levels by exercising and minimizing your intake of saturated fat. Eat a balanced diet with plenty of fruit and vegetables. Blood tests for lipids and cholesterol should begin at age 45 and be repeated every 5 years. If your lipid or cholesterol levels are high, you are over 50, or you are at high risk for heart disease, you may need your cholesterol levels checked more frequently.Ongoing high lipid and cholesterol levels should be treated with medicines if diet and exercise are not working.  If you smoke, find out from your health care provider how to quit. If you do not use tobacco, do not start.  Lung cancer screening is recommended for adults aged 45-80 years who are at high risk for developing lung cancer because of a history of smoking. A yearly low-dose CT scan of the lungs is recommended for people who have at least a 30-pack-year history of smoking and are a current smoker or have quit within the past 15 years. A pack year of smoking is smoking an average of 1 pack of cigarettes a day for 1 year (for example: 1 pack a day for 30 years or 2 packs a day for 15 years). Yearly screening should continue until the smoker has stopped smoking for at least 15 years. Yearly screening should be stopped for people who develop a health problem that would prevent them from having lung cancer treatment.  If you are pregnant, do not drink alcohol. If you are  breastfeeding, be very cautious about drinking alcohol. If you are not pregnant and choose to drink alcohol, do not have more than 1 drink per day. One drink is considered to be 12 ounces (355 mL) of beer, 5 ounces (148 mL) of wine, or 1.5 ounces (44 mL) of liquor.  Avoid use of street drugs. Do not share needles with anyone. Ask for help if you need support or instructions about stopping the use of drugs.  High blood pressure causes heart disease and increases the risk  of stroke. Your blood pressure should be checked at least every 1 to 2 years. Ongoing high blood pressure should be treated with medicines if weight loss and exercise do not work.  If you are 55-79 years old, ask your health care provider if you should take aspirin to prevent strokes.  Diabetes screening is done by taking a blood sample to check your blood glucose level after you have not eaten for a certain period of time (fasting). If you are not overweight and you do not have risk factors for diabetes, you should be screened once every 3 years starting at age 45. If you are overweight or obese and you are 40-70 years of age, you should be screened for diabetes every year as part of your cardiovascular risk assessment.  Breast cancer screening is essential preventive care for women. You should practice "breast self-awareness." This means understanding the normal appearance and feel of your breasts and may include breast self-examination. Any changes detected, no matter how small, should be reported to a health care provider. Women in their 20s and 30s should have a clinical breast exam (CBE) by a health care provider as part of a regular health exam every 1 to 3 years. After age 40, women should have a CBE every year. Starting at age 40, women should consider having a mammogram (breast X-ray test) every year. Women who have a family history of breast cancer should talk to their health care provider about genetic screening. Women at a high risk of breast cancer should talk to their health care providers about having an MRI and a mammogram every year.  Breast cancer gene (BRCA)-related cancer risk assessment is recommended for women who have family members with BRCA-related cancers. BRCA-related cancers include breast, ovarian, tubal, and peritoneal cancers. Having family members with these cancers may be associated with an increased risk for harmful changes (mutations) in the breast cancer genes BRCA1 and  BRCA2. Results of the assessment will determine the need for genetic counseling and BRCA1 and BRCA2 testing.  Your health care provider may recommend that you be screened regularly for cancer of the pelvic organs (ovaries, uterus, and vagina). This screening involves a pelvic examination, including checking for microscopic changes to the surface of your cervix (Pap test). You may be encouraged to have this screening done every 3 years, beginning at age 21.  For women ages 30-65, health care providers may recommend pelvic exams and Pap testing every 3 years, or they may recommend the Pap and pelvic exam, combined with testing for human papilloma virus (HPV), every 5 years. Some types of HPV increase your risk of cervical cancer. Testing for HPV may also be done on women of any age with unclear Pap test results.  Other health care providers may not recommend any screening for nonpregnant women who are considered low risk for pelvic cancer and who do not have symptoms. Ask your health care provider if a screening pelvic exam is right for   you.  If you have had past treatment for cervical cancer or a condition that could lead to cancer, you need Pap tests and screening for cancer for at least 20 years after your treatment. If Pap tests have been discontinued, your risk factors (such as having a new sexual partner) need to be reassessed to determine if screening should resume. Some women have medical problems that increase the chance of getting cervical cancer. In these cases, your health care provider may recommend more frequent screening and Pap tests.  Colorectal cancer can be detected and often prevented. Most routine colorectal cancer screening begins at the age of 50 years and continues through age 75 years. However, your health care provider may recommend screening at an earlier age if you have risk factors for colon cancer. On a yearly basis, your health care provider may provide home test kits to check  for hidden blood in the stool. Use of a small camera at the end of a tube, to directly examine the colon (sigmoidoscopy or colonoscopy), can detect the earliest forms of colorectal cancer. Talk to your health care provider about this at age 50, when routine screening begins. Direct exam of the colon should be repeated every 5-10 years through age 75 years, unless early forms of precancerous polyps or small growths are found.  People who are at an increased risk for hepatitis B should be screened for this virus. You are considered at high risk for hepatitis B if:  You were born in a country where hepatitis B occurs often. Talk with your health care provider about which countries are considered high risk.  Your parents were born in a high-risk country and you have not received a shot to protect against hepatitis B (hepatitis B vaccine).  You have HIV or AIDS.  You use needles to inject street drugs.  You live with, or have sex with, someone who has hepatitis B.  You get hemodialysis treatment.  You take certain medicines for conditions like cancer, organ transplantation, and autoimmune conditions.  Hepatitis C blood testing is recommended for all people born from 1945 through 1965 and any individual with known risks for hepatitis C.  Practice safe sex. Use condoms and avoid high-risk sexual practices to reduce the spread of sexually transmitted infections (STIs). STIs include gonorrhea, chlamydia, syphilis, trichomonas, herpes, HPV, and human immunodeficiency virus (HIV). Herpes, HIV, and HPV are viral illnesses that have no cure. They can result in disability, cancer, and death.  You should be screened for sexually transmitted illnesses (STIs) including gonorrhea and chlamydia if:  You are sexually active and are younger than 24 years.  You are older than 24 years and your health care provider tells you that you are at risk for this type of infection.  Your sexual activity has changed  since you were last screened and you are at an increased risk for chlamydia or gonorrhea. Ask your health care provider if you are at risk.  If you are at risk of being infected with HIV, it is recommended that you take a prescription medicine daily to prevent HIV infection. This is called preexposure prophylaxis (PrEP). You are considered at risk if:  You are sexually active and do not regularly use condoms or know the HIV status of your partner(s).  You take drugs by injection.  You are sexually active with a partner who has HIV.  Talk with your health care provider about whether you are at high risk of being infected with HIV. If   you choose to begin PrEP, you should first be tested for HIV. You should then be tested every 3 months for as long as you are taking PrEP.  Osteoporosis is a disease in which the bones lose minerals and strength with aging. This can result in serious bone fractures or breaks. The risk of osteoporosis can be identified using a bone density scan. Women ages 67 years and over and women at risk for fractures or osteoporosis should discuss screening with their health care providers. Ask your health care provider whether you should take a calcium supplement or vitamin D to reduce the rate of osteoporosis.  Menopause can be associated with physical symptoms and risks. Hormone replacement therapy is available to decrease symptoms and risks. You should talk to your health care provider about whether hormone replacement therapy is right for you.  Use sunscreen. Apply sunscreen liberally and repeatedly throughout the day. You should seek shade when your shadow is shorter than you. Protect yourself by wearing long sleeves, pants, a wide-brimmed hat, and sunglasses year round, whenever you are outdoors.  Once a month, do a whole body skin exam, using a mirror to look at the skin on your back. Tell your health care provider of new moles, moles that have irregular borders, moles that  are larger than a pencil eraser, or moles that have changed in shape or color.  Stay current with required vaccines (immunizations).  Influenza vaccine. All adults should be immunized every year.  Tetanus, diphtheria, and acellular pertussis (Td, Tdap) vaccine. Pregnant women should receive 1 dose of Tdap vaccine during each pregnancy. The dose should be obtained regardless of the length of time since the last dose. Immunization is preferred during the 27th-36th week of gestation. An adult who has not previously received Tdap or who does not know her vaccine status should receive 1 dose of Tdap. This initial dose should be followed by tetanus and diphtheria toxoids (Td) booster doses every 10 years. Adults with an unknown or incomplete history of completing a 3-dose immunization series with Td-containing vaccines should begin or complete a primary immunization series including a Tdap dose. Adults should receive a Td booster every 10 years.  Varicella vaccine. An adult without evidence of immunity to varicella should receive 2 doses or a second dose if she has previously received 1 dose. Pregnant females who do not have evidence of immunity should receive the first dose after pregnancy. This first dose should be obtained before leaving the health care facility. The second dose should be obtained 4-8 weeks after the first dose.  Human papillomavirus (HPV) vaccine. Females aged 13-26 years who have not received the vaccine previously should obtain the 3-dose series. The vaccine is not recommended for use in pregnant females. However, pregnancy testing is not needed before receiving a dose. If a female is found to be pregnant after receiving a dose, no treatment is needed. In that case, the remaining doses should be delayed until after the pregnancy. Immunization is recommended for any person with an immunocompromised condition through the age of 61 years if she did not get any or all doses earlier. During the  3-dose series, the second dose should be obtained 4-8 weeks after the first dose. The third dose should be obtained 24 weeks after the first dose and 16 weeks after the second dose.  Zoster vaccine. One dose is recommended for adults aged 30 years or older unless certain conditions are present.  Measles, mumps, and rubella (MMR) vaccine. Adults born  before 1957 generally are considered immune to measles and mumps. Adults born in 1957 or later should have 1 or more doses of MMR vaccine unless there is a contraindication to the vaccine or there is laboratory evidence of immunity to each of the three diseases. A routine second dose of MMR vaccine should be obtained at least 28 days after the first dose for students attending postsecondary schools, health care workers, or international travelers. People who received inactivated measles vaccine or an unknown type of measles vaccine during 1963-1967 should receive 2 doses of MMR vaccine. People who received inactivated mumps vaccine or an unknown type of mumps vaccine before 1979 and are at high risk for mumps infection should consider immunization with 2 doses of MMR vaccine. For females of childbearing age, rubella immunity should be determined. If there is no evidence of immunity, females who are not pregnant should be vaccinated. If there is no evidence of immunity, females who are pregnant should delay immunization until after pregnancy. Unvaccinated health care workers born before 1957 who lack laboratory evidence of measles, mumps, or rubella immunity or laboratory confirmation of disease should consider measles and mumps immunization with 2 doses of MMR vaccine or rubella immunization with 1 dose of MMR vaccine.  Pneumococcal 13-valent conjugate (PCV13) vaccine. When indicated, a person who is uncertain of his immunization history and has no record of immunization should receive the PCV13 vaccine. All adults 65 years of age and older should receive this  vaccine. An adult aged 19 years or older who has certain medical conditions and has not been previously immunized should receive 1 dose of PCV13 vaccine. This PCV13 should be followed with a dose of pneumococcal polysaccharide (PPSV23) vaccine. Adults who are at high risk for pneumococcal disease should obtain the PPSV23 vaccine at least 8 weeks after the dose of PCV13 vaccine. Adults older than 34 years of age who have normal immune system function should obtain the PPSV23 vaccine dose at least 1 year after the dose of PCV13 vaccine.  Pneumococcal polysaccharide (PPSV23) vaccine. When PCV13 is also indicated, PCV13 should be obtained first. All adults aged 65 years and older should be immunized. An adult younger than age 65 years who has certain medical conditions should be immunized. Any person who resides in a nursing home or long-term care facility should be immunized. An adult smoker should be immunized. People with an immunocompromised condition and certain other conditions should receive both PCV13 and PPSV23 vaccines. People with human immunodeficiency virus (HIV) infection should be immunized as soon as possible after diagnosis. Immunization during chemotherapy or radiation therapy should be avoided. Routine use of PPSV23 vaccine is not recommended for American Indians, [place] Natives, or people younger than 65 years unless there are medical conditions that require PPSV23 vaccine. When indicated, people who have unknown immunization and have no record of immunization should receive PPSV23 vaccine. One-time revaccination 5 years after the first dose of PPSV23 is recommended for people aged 19-64 years who have chronic kidney failure, nephrotic syndrome, asplenia, or immunocompromised conditions. People who received 1-2 doses of PPSV23 before age 65 years should receive another dose of PPSV23 vaccine at age 65 years or later if at least 5 years have passed since the previous dose. Doses of PPSV23 are not  needed for people immunized with PPSV23 at or after age 65 years.  Meningococcal vaccine. Adults with asplenia or persistent complement component deficiencies should receive 2 doses of quadrivalent meningococcal conjugate (MenACWY-D) vaccine. The doses should be obtained   at least 2 months apart. Microbiologists working with certain meningococcal bacteria, [place] recruits, people at risk during an outbreak, and people who travel to or live in countries with a high rate of meningitis should be immunized. A first-year college student up through age 34 years who is living in a residence hall should receive a dose if she did not receive a dose on or after her 16th birthday. Adults who have certain high-risk conditions should receive one or more doses of vaccine.  Hepatitis A vaccine. Adults who wish to be protected from this disease, have certain high-risk conditions, work with hepatitis A-infected animals, work in hepatitis A research labs, or travel to or work in countries with a high rate of hepatitis A should be immunized. Adults who were previously unvaccinated and who anticipate close contact with an international adoptee during the first 60 days after arrival in the [place] from a country with a high rate of hepatitis A should be immunized.  Hepatitis B vaccine. Adults who wish to be protected from this disease, have certain high-risk conditions, may be exposed to blood or other infectious body fluids, are household contacts or sex partners of hepatitis B positive people, are clients or workers in certain care facilities, or travel to or work in countries with a high rate of hepatitis B should be immunized.  Haemophilus influenzae type b (Hib) vaccine. A previously unvaccinated person with asplenia or sickle cell disease or having a scheduled splenectomy should receive 1 dose of Hib vaccine. Regardless of previous immunization, a recipient of a hematopoietic stem cell transplant should receive a  3-dose series 6-12 months after her successful transplant. Hib vaccine is not recommended for adults with HIV infection. Preventive Services / Frequency Ages 35 to 4 years  Blood pressure check.** / Every 3-5 years.  Lipid and cholesterol check.** / Every 5 years beginning at age 60.  Clinical breast exam.** / Every 3 years for women in their 71s and 10s.  BRCA-related cancer risk assessment.** / For women who have family members with a BRCA-related cancer (breast, ovarian, tubal, or peritoneal cancers).  Pap test.** / Every 2 years from ages 76 through 26. Every 3 years starting at age 61 through age 76 or 93 with a history of 3 consecutive normal Pap tests.  HPV screening.** / Every 3 years from ages 37 through ages 60 to 51 with a history of 3 consecutive normal Pap tests.  Hepatitis C blood test.** / For any individual with known risks for hepatitis C.  Skin self-exam. / Monthly.  Influenza vaccine. / Every year.  Tetanus, diphtheria, and acellular pertussis (Tdap, Td) vaccine.** / Consult your health care provider. Pregnant women should receive 1 dose of Tdap vaccine during each pregnancy. 1 dose of Td every 10 years.  Varicella vaccine.** / Consult your health care provider. Pregnant females who do not have evidence of immunity should receive the first dose after pregnancy.  HPV vaccine. / 3 doses over 6 months, if 93 and younger. The vaccine is not recommended for use in pregnant females. However, pregnancy testing is not needed before receiving a dose.  Measles, mumps, rubella (MMR) vaccine.** / You need at least 1 dose of MMR if you were born in 1957 or later. You may also need a 2nd dose. For females of childbearing age, rubella immunity should be determined. If there is no evidence of immunity, females who are not pregnant should be vaccinated. If there is no evidence of immunity, females who are  pregnant should delay immunization until after pregnancy.  Pneumococcal  13-valent conjugate (PCV13) vaccine.** / Consult your health care provider.  Pneumococcal polysaccharide (PPSV23) vaccine.** / 1 to 2 doses if you smoke cigarettes or if you have certain conditions.  Meningococcal vaccine.** / 1 dose if you are age 68 to 8 years and a Market researcher living in a residence hall, or have one of several medical conditions, you need to get vaccinated against meningococcal disease. You may also need additional booster doses.  Hepatitis A vaccine.** / Consult your health care provider.  Hepatitis B vaccine.** / Consult your health care provider.  Haemophilus influenzae type b (Hib) vaccine.** / Consult your health care provider. Ages 7 to 53 years  Blood pressure check.** / Every year.  Lipid and cholesterol check.** / Every 5 years beginning at age 25 years.  Lung cancer screening. / Every year if you are aged 11-80 years and have a 30-pack-year history of smoking and currently smoke or have quit within the past 15 years. Yearly screening is stopped once you have quit smoking for at least 15 years or develop a health problem that would prevent you from having lung cancer treatment.  Clinical breast exam.** / Every year after age 48 years.  BRCA-related cancer risk assessment.** / For women who have family members with a BRCA-related cancer (breast, ovarian, tubal, or peritoneal cancers).  Mammogram.** / Every year beginning at age 41 years and continuing for as long as you are in good health. Consult with your health care provider.  Pap test.** / Every 3 years starting at age 65 years through age 37 or 70 years with a history of 3 consecutive normal Pap tests.  HPV screening.** / Every 3 years from ages 72 years through ages 60 to 40 years with a history of 3 consecutive normal Pap tests.  Fecal occult blood test (FOBT) of stool. / Every year beginning at age 21 years and continuing until age 5 years. You may not need to do this test if you get  a colonoscopy every 10 years.  Flexible sigmoidoscopy or colonoscopy.** / Every 5 years for a flexible sigmoidoscopy or every 10 years for a colonoscopy beginning at age 35 years and continuing until age 48 years.  Hepatitis C blood test.** / For all people born from [birth year] through 1965 and any individual with known risks for hepatitis C.  Skin self-exam. / Monthly.  Influenza vaccine. / Every year.  Tetanus, diphtheria, and acellular pertussis (Tdap/Td) vaccine.** / Consult your health care provider. Pregnant women should receive 1 dose of Tdap vaccine during each pregnancy. 1 dose of Td every 10 years.  Varicella vaccine.** / Consult your health care provider. Pregnant females who do not have evidence of immunity should receive the first dose after pregnancy.  Zoster vaccine.** / 1 dose for adults aged 30 years or older.  Measles, mumps, rubella (MMR) vaccine.** / You need at least 1 dose of MMR if you were born in 1957 or later. You may also need a second dose. For females of childbearing age, rubella immunity should be determined. If there is no evidence of immunity, females who are not pregnant should be vaccinated. If there is no evidence of immunity, females who are pregnant should delay immunization until after pregnancy.  Pneumococcal 13-valent conjugate (PCV13) vaccine.** / Consult your health care provider.  Pneumococcal polysaccharide (PPSV23) vaccine.** / 1 to 2 doses if you smoke cigarettes or if you have certain conditions.  Meningococcal vaccine.** /  Consult your health care provider.  Hepatitis A vaccine.** / Consult your health care provider.  Hepatitis B vaccine.** / Consult your health care provider.  Haemophilus influenzae type b (Hib) vaccine.** / Consult your health care provider. Ages 64 years and over  Blood pressure check.** / Every year.  Lipid and cholesterol check.** / Every 5 years beginning at age 23 years.  Lung cancer screening. / Every year if you  are aged 16-80 years and have a 30-pack-year history of smoking and currently smoke or have quit within the past 15 years. Yearly screening is stopped once you have quit smoking for at least 15 years or develop a health problem that would prevent you from having lung cancer treatment.  Clinical breast exam.** / Every year after age 74 years.  BRCA-related cancer risk assessment.** / For women who have family members with a BRCA-related cancer (breast, ovarian, tubal, or peritoneal cancers).  Mammogram.** / Every year beginning at age 44 years and continuing for as long as you are in good health. Consult with your health care provider.  Pap test.** / Every 3 years starting at age 58 years through age 22 or 39 years with 3 consecutive normal Pap tests. Testing can be stopped between 65 and 70 years with 3 consecutive normal Pap tests and no abnormal Pap or HPV tests in the past 10 years.  HPV screening.** / Every 3 years from ages 64 years through ages 70 or 61 years with a history of 3 consecutive normal Pap tests. Testing can be stopped between 65 and 70 years with 3 consecutive normal Pap tests and no abnormal Pap or HPV tests in the past 10 years.  Fecal occult blood test (FOBT) of stool. / Every year beginning at age 40 years and continuing until age 27 years. You may not need to do this test if you get a colonoscopy every 10 years.  Flexible sigmoidoscopy or colonoscopy.** / Every 5 years for a flexible sigmoidoscopy or every 10 years for a colonoscopy beginning at age 7 years and continuing until age 32 years.  Hepatitis C blood test.** / For all people born from [birth year] through 1965 and any individual with known risks for hepatitis C.  Osteoporosis screening.** / A one-time screening for women ages 30 years and over and women at risk for fractures or osteoporosis.  Skin self-exam. / Monthly.  Influenza vaccine. / Every year.  Tetanus, diphtheria, and acellular pertussis (Tdap/Td)  vaccine.** / 1 dose of Td every 10 years.  Varicella vaccine.** / Consult your health care provider.  Zoster vaccine.** / 1 dose for adults aged 35 years or older.  Pneumococcal 13-valent conjugate (PCV13) vaccine.** / Consult your health care provider.  Pneumococcal polysaccharide (PPSV23) vaccine.** / 1 dose for all adults aged 46 years and older.  Meningococcal vaccine.** / Consult your health care provider.  Hepatitis A vaccine.** / Consult your health care provider.  Hepatitis B vaccine.** / Consult your health care provider.  Haemophilus influenzae type b (Hib) vaccine.** / Consult your health care provider. ** Family history and personal history of risk and conditions may change your health care provider's recommendations.   This information is not intended to replace advice given to you by your health care provider. Make sure you discuss any questions you have with your health care provider.   Document Released: 04/25/2001 Document Revised: 03/20/2014 Document Reviewed: 07/25/2010 Elsevier Interactive Patient Education [employer].

## 2015-02-13 DIAGNOSIS — R928 Other abnormal and inconclusive findings on diagnostic imaging of breast: Secondary | ICD-10-CM | POA: Insufficient documentation

## 2015-02-13 DIAGNOSIS — E782 Mixed hyperlipidemia: Secondary | ICD-10-CM | POA: Insufficient documentation

## 2015-02-13 DIAGNOSIS — N920 Excessive and frequent menstruation with regular cycle: Secondary | ICD-10-CM | POA: Insufficient documentation

## 2015-02-13 NOTE — Assessment & Plan Note (Signed)
Ultrasound ordered today. No new concerns.

## 2015-02-13 NOTE — Assessment & Plan Note (Signed)
No pain today

## 2015-02-13 NOTE — Assessment & Plan Note (Signed)
Encouraged increased hydration, 64 ounces of clear fluids daily. Minimize alcohol and caffeine. Eat small frequent meals with lean proteins and complex carbs. Avoid high and low blood sugars. Get adequate sleep, 7-8 hours a night. Needs exercise daily preferably in the morning. Encouraged heart healthy diet, increase exercise, avoid trans fats, consider a krill oil cap daily

## 2015-02-13 NOTE — Assessment & Plan Note (Signed)
Encouraged increased hydration and fiber in diet. Daily probiotics. If bowels not moving can use MOM 2 tbls po in 4 oz of warm prune juice by mouth every 2-3 days. If no results then repeat in 4 hours with  Dulcolax suppository pr, may repeat again in 4 more hours as needed.  
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
## 2015-02-13 NOTE — Assessment & Plan Note (Signed)
Will start OCP with first Sunday of next cycle and notify us if no improvement.

## 2015-02-13 NOTE — Assessment & Plan Note (Addendum)
Encouraged moist heat and gentle stretching as tolerated. May try NSAIDs and prescription meds as directed and report if symptoms worsen or seek immediate care. May continue Zanaflex and Meloxicam prn.

## 2015-02-24 ENCOUNTER — Ambulatory Visit
Admission: RE | Admit: 2015-02-24 | Discharge: 2015-02-24 | Disposition: A | Discharge status: Home / Self Care | Payer: BLUE CROSS/BLUE SHIELD | Source: Ambulatory Visit | Attending: Family Medicine | Admitting: Family Medicine

## 2015-02-24 DIAGNOSIS — R928 Other abnormal and inconclusive findings on diagnostic imaging of breast: Secondary | ICD-10-CM

## 2015-03-10 ENCOUNTER — Telehealth: Payer: Self-pay | Admitting: Family Medicine

## 2015-03-10 NOTE — Telephone Encounter (Signed)
Pt never picked up document - Request of Medical Information to support Disability-11-24-14 (Document mailed to Pt)

## 2015-08-10 ENCOUNTER — Ambulatory Visit: Payer: Self-pay | Admitting: Family Medicine

## 2015-09-27 ENCOUNTER — Telehealth: Payer: Self-pay | Admitting: *Deleted

## 2015-09-27 NOTE — Telephone Encounter (Signed)
Pt signed records release received via mail requesting records from 06/27/14 to present. All records from our office faxed to [phone] successfully Request sent for scanning

## 2015-10-11 ENCOUNTER — Encounter: Payer: Self-pay | Admitting: Family Medicine

## 2015-10-11 ENCOUNTER — Ambulatory Visit (HOSPITAL_BASED_OUTPATIENT_CLINIC_OR_DEPARTMENT_OTHER)
Admission: RE | Admit: 2015-10-11 | Discharge: 2015-10-11 | Disposition: A | Discharge status: Home / Self Care | Payer: BLUE CROSS/BLUE SHIELD | Source: Ambulatory Visit | Attending: Family Medicine | Admitting: Family Medicine

## 2015-10-11 ENCOUNTER — Ambulatory Visit (INDEPENDENT_AMBULATORY_CARE_PROVIDER_SITE_OTHER): Payer: BLUE CROSS/BLUE SHIELD | Admitting: Family Medicine

## 2015-10-11 VITALS — BP 110/70 | HR 64 | Temp 98.2°F | Ht <= 58 in | Wt 112.0 lb

## 2015-10-11 DIAGNOSIS — R0602 Shortness of breath: Secondary | ICD-10-CM | POA: Insufficient documentation

## 2015-10-11 DIAGNOSIS — R0789 Other chest pain: Secondary | ICD-10-CM

## 2015-10-11 LAB — TROPONIN I: TNIDX: 0 ug/l (ref 0.00–0.06)

## 2015-10-11 MED ORDER — MELOXICAM 15 MG PO TABS: 15.0000 mg | ORAL_TABLET | Freq: Every day | ORAL | 0 refills | Status: DC
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
## 2015-10-11 NOTE — Patient Instructions (Signed)
I think that your pain is due to chest wall soreness and do not think it has to do with your heart.  Your EKG looks good today We will check a blood test for your heart and also do a Chest x-ray to make sure that all is well Your eyes do not really appear yellow to me, but we will check on your liver function  After you have your blood drawn please go downstairs and have your x-ray; then you can go home

## 2015-10-11 NOTE — Progress Notes (Signed)
Pre visit review using our clinic review tool, if applicable. No additional management support is needed unless otherwise documented below in the visit note. 

## 2015-10-12 ENCOUNTER — Encounter: Payer: Self-pay | Admitting: Family Medicine

## 2015-10-12 LAB — COMPREHENSIVE METABOLIC PANEL
ALK PHOS: 62 U/L (ref 39–117)
ALT: 18 U/L (ref 0–35)
AST: 22 U/L (ref 0–37)
Albumin: 4.2 g/dL (ref 3.5–5.2)
BILIRUBIN TOTAL: 0.5 mg/dL (ref 0.2–1.2)
BUN: 15 mg/dL (ref 6–23)
CALCIUM: 9.4 mg/dL (ref 8.4–10.5)
CO2: 28 mEq/L (ref 19–32)
Chloride: 103 mEq/L (ref 96–112)
Creatinine, Ser: 0.72 mg/dL (ref 0.40–1.20)
GFR: 98.09 mL/min (ref >60.00)
Glucose, Bld: 74 mg/dL (ref 70–99)
POTASSIUM: 3.8 meq/L (ref 3.5–5.1)
Sodium: 137 mEq/L (ref 135–145)
TOTAL PROTEIN: 7.4 g/dL (ref 6.0–8.3)

## 2016-01-14 ENCOUNTER — Telehealth: Payer: Self-pay | Admitting: Family Medicine

## 2016-01-14 NOTE — Telephone Encounter (Signed)
Pt's spouse Shanon Brow called in on behalf of pt. He says that pt would like to switch providers. Pt would like to now see Dr. Lorelei Pont.    Is this switch okay?

## 2016-01-14 NOTE — Telephone Encounter (Signed)
ok 

## 2016-01-15 NOTE — Telephone Encounter (Signed)
OK with me.

## 2016-01-18 NOTE — Telephone Encounter (Signed)
lvm for pt to call back to schedule an appt to establish care.

## 2016-03-23 ENCOUNTER — Ambulatory Visit (INDEPENDENT_AMBULATORY_CARE_PROVIDER_SITE_OTHER): Payer: BLUE CROSS/BLUE SHIELD | Admitting: Family Medicine

## 2016-03-23 ENCOUNTER — Ambulatory Visit (HOSPITAL_BASED_OUTPATIENT_CLINIC_OR_DEPARTMENT_OTHER)
Admission: RE | Admit: 2016-03-23 | Discharge: 2016-03-23 | Disposition: A | Discharge status: Home / Self Care | Payer: BLUE CROSS/BLUE SHIELD | Source: Ambulatory Visit | Attending: Family Medicine | Admitting: Family Medicine

## 2016-03-23 VITALS — BP 108/65 | HR 64 | Temp 98.5°F | Ht <= 58 in | Wt 114.2 lb

## 2016-03-23 DIAGNOSIS — G8929 Other chronic pain: Secondary | ICD-10-CM

## 2016-03-23 DIAGNOSIS — R1031 Right lower quadrant pain: Secondary | ICD-10-CM | POA: Insufficient documentation

## 2016-03-23 DIAGNOSIS — R102 Pelvic and perineal pain: Secondary | ICD-10-CM | POA: Diagnosis not present

## 2016-03-23 DIAGNOSIS — N6320 Unspecified lump in the left breast, unspecified quadrant: Secondary | ICD-10-CM | POA: Diagnosis not present

## 2016-03-23 DIAGNOSIS — Z23 Encounter for immunization: Secondary | ICD-10-CM | POA: Diagnosis not present

## 2016-03-23 LAB — POCT URINE PREGNANCY: PREG TEST UR: NEGATIVE

## 2016-03-23 NOTE — Progress Notes (Signed)
Pre visit review using our clinic review tool, if applicable. No additional management support is needed unless otherwise documented below in the visit note. 
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
## 2016-03-23 NOTE — Patient Instructions (Addendum)
Please go to the lab and then down to the ground floor imaging department to have your x-ray done. They you can go home and I will call with your results asap  We will also arrange for you to have your breast ultrasound and to see a GYN doctor about your chronic pain

## 2016-03-24 LAB — CBC
HCT: 37.3 % (ref 36.0–46.0)
Hemoglobin: 12.9 g/dL (ref 12.0–15.0)
MCHC: 34.4 g/dL (ref 30.0–36.0)
MCV: 93.3 fl (ref 78.0–100.0)
PLATELETS: 263 10*3/uL (ref 150.0–400.0)
RBC: 4 Mil/uL (ref 3.87–5.11)
RDW: 13.4 % (ref 11.5–15.5)
WBC: 6.1 10*3/uL (ref 4.0–10.5)

## 2016-03-24 LAB — COMPREHENSIVE METABOLIC PANEL
ALBUMIN: 4.3 g/dL (ref 3.5–5.2)
ALK PHOS: 60 U/L (ref 39–117)
ALT: 16 U/L (ref 0–35)
AST: 21 U/L (ref 0–37)
BUN: 10 mg/dL (ref 6–23)
CALCIUM: 9.3 mg/dL (ref 8.4–10.5)
CHLORIDE: 104 meq/L (ref 96–112)
CO2: 29 mEq/L (ref 19–32)
CREATININE: 0.59 mg/dL (ref 0.40–1.20)
GFR: 123.11 mL/min (ref >60.00)
Glucose, Bld: 75 mg/dL (ref 70–99)
Potassium: 3.5 mEq/L (ref 3.5–5.1)
Sodium: 139 mEq/L (ref 135–145)
TOTAL PROTEIN: 7.2 g/dL (ref 6.0–8.3)
Total Bilirubin: 0.5 mg/dL (ref 0.2–1.2)

## 2016-03-24 NOTE — Addendum Note (Signed)
Addended by: Lamar Blinks C on: 03/24/2016 05:24 PM   Modules accepted: Orders

## 2016-04-04 ENCOUNTER — Other Ambulatory Visit: Payer: Self-pay | Admitting: Family Medicine

## 2016-04-04 ENCOUNTER — Ambulatory Visit
Admission: RE | Admit: 2016-04-04 | Discharge: 2016-04-04 | Disposition: A | Discharge status: Home / Self Care | Payer: BLUE CROSS/BLUE SHIELD | Source: Ambulatory Visit | Attending: Family Medicine | Admitting: Family Medicine

## 2016-04-04 DIAGNOSIS — N6320 Unspecified lump in the left breast, unspecified quadrant: Secondary | ICD-10-CM
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
## 2016-09-03 IMAGING — DX DG CHEST 2V
2 series · 2 of 2 positions shown · non-contrast
Comparison: None

CLINICAL DATA: Chest pain for 2 months.

EXAM:
CHEST - 2 VIEW

[chest pa]
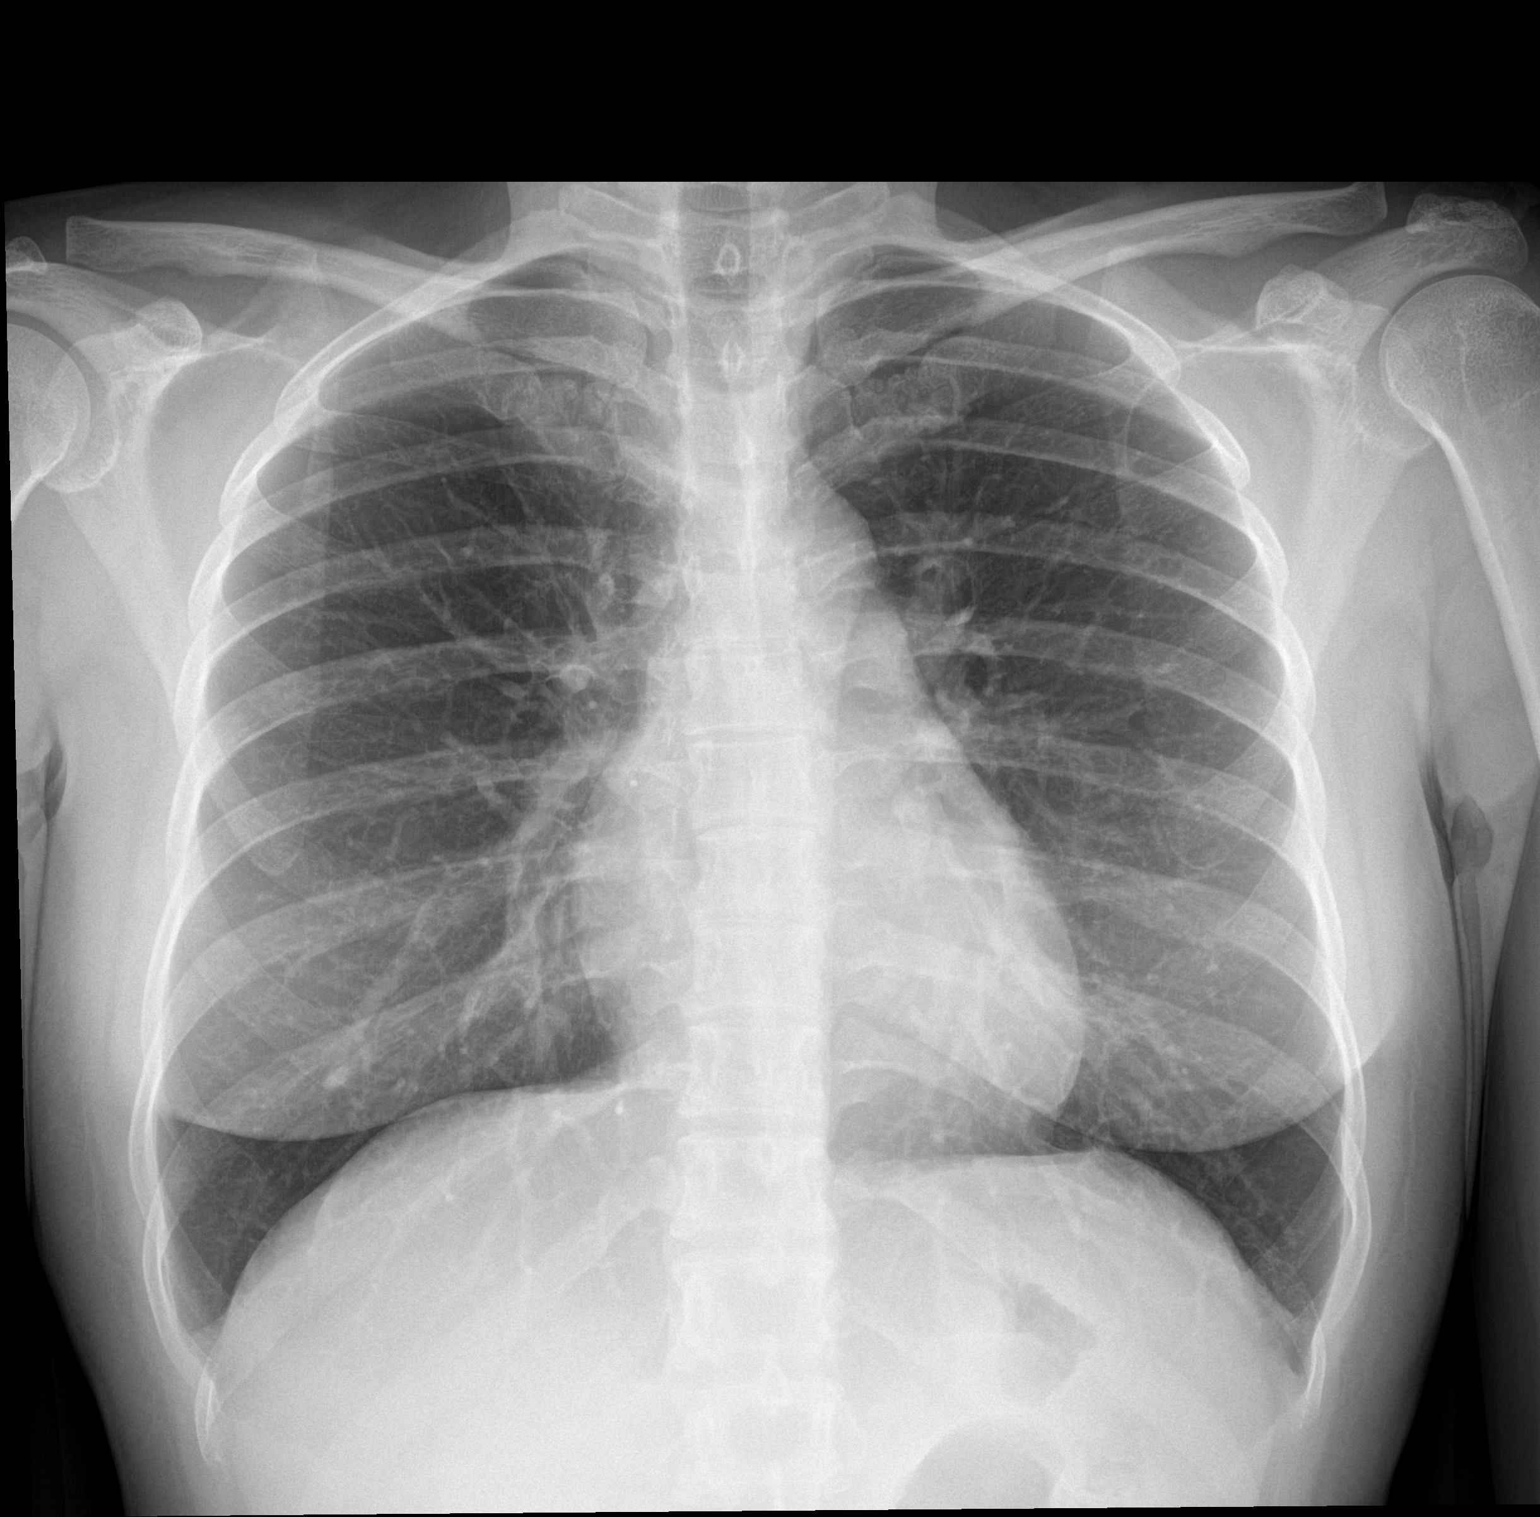

[chest lat]
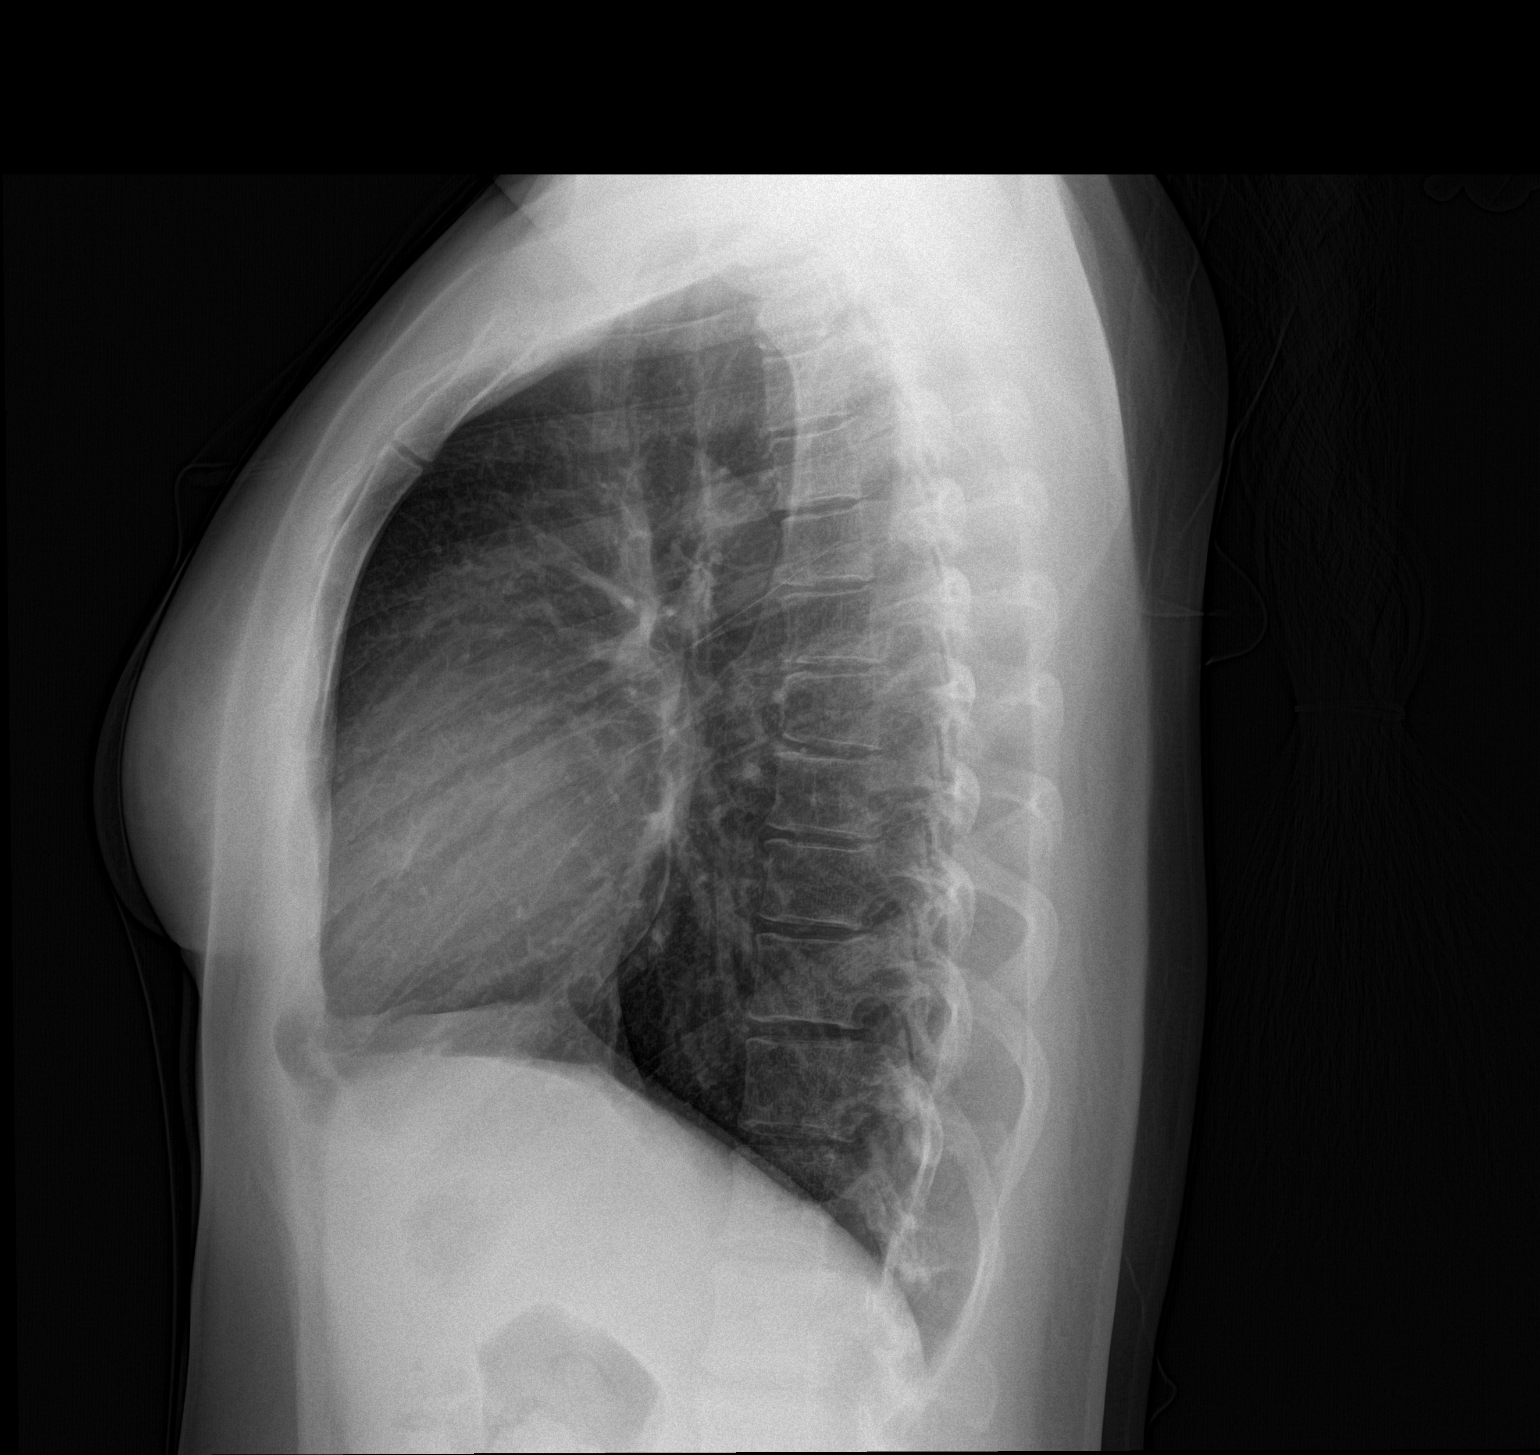

[2 of 2 positions shown; findings below may reference images not displayed]

FINDINGS: The heart size and mediastinal contours are within normal limits.
There is no evidence of pulmonary edema, consolidation,
pneumothorax, nodule or pleural fluid. The visualized skeletal
structures are unremarkable.
IMPRESSION: No active disease.

## 2016-11-08 ENCOUNTER — Ambulatory Visit (INDEPENDENT_AMBULATORY_CARE_PROVIDER_SITE_OTHER): Payer: Self-pay | Admitting: Family Medicine

## 2016-11-08 ENCOUNTER — Other Ambulatory Visit (HOSPITAL_COMMUNITY)
Admission: RE | Admit: 2016-11-08 | Discharge: 2016-11-08 | Disposition: A | Discharge status: Home / Self Care | Payer: Self-pay | Source: Ambulatory Visit | Attending: Family Medicine | Admitting: Family Medicine

## 2016-11-08 ENCOUNTER — Encounter: Payer: Self-pay | Admitting: Family Medicine

## 2016-11-08 VITALS — BP 111/73 | HR 64 | Temp 97.8°F | Ht 59.0 in | Wt 107.2 lb

## 2016-11-08 DIAGNOSIS — Z124 Encounter for screening for malignant neoplasm of cervix: Secondary | ICD-10-CM

## 2016-11-08 DIAGNOSIS — Z131 Encounter for screening for diabetes mellitus: Secondary | ICD-10-CM

## 2016-11-08 DIAGNOSIS — Z0000 Encounter for general adult medical examination without abnormal findings: Secondary | ICD-10-CM

## 2016-11-08 DIAGNOSIS — Z13220 Encounter for screening for lipoid disorders: Secondary | ICD-10-CM

## 2016-11-08 DIAGNOSIS — Z130 Encounter for screening for diseases of the blood and blood-forming organs and certain disorders involving the immune mechanism: Secondary | ICD-10-CM

## 2016-11-08 DIAGNOSIS — Z23 Encounter for immunization: Secondary | ICD-10-CM

## 2016-11-08 DIAGNOSIS — Z7184 Encounter for health counseling related to travel: Secondary | ICD-10-CM

## 2016-11-08 DIAGNOSIS — Z7189 Other specified counseling: Secondary | ICD-10-CM

## 2016-11-08 DIAGNOSIS — Z119 Encounter for screening for infectious and parasitic diseases, unspecified: Secondary | ICD-10-CM

## 2016-11-08 LAB — CBC
HEMATOCRIT: 36.6 % (ref 36.0–46.0)
HEMOGLOBIN: 12.2 g/dL (ref 12.0–15.0)
MCHC: 33.3 g/dL (ref 30.0–36.0)
MCV: 94.7 fl (ref 78.0–100.0)
PLATELETS: 268 10*3/uL (ref 150.0–400.0)
RBC: 3.86 Mil/uL — AB (ref 3.87–5.11)
RDW: 14.7 % (ref 11.5–15.5)
WBC: 4.8 10*3/uL (ref 4.0–10.5)

## 2016-11-08 LAB — COMPREHENSIVE METABOLIC PANEL
ALBUMIN: 4.3 g/dL (ref 3.5–5.2)
ALK PHOS: 44 U/L (ref 39–117)
ALT: 11 U/L (ref 0–35)
AST: 17 U/L (ref 0–37)
BUN: 9 mg/dL (ref 6–23)
CO2: 31 mEq/L (ref 19–32)
Calcium: 9.9 mg/dL (ref 8.4–10.5)
Chloride: 103 mEq/L (ref 96–112)
Creatinine, Ser: 0.67 mg/dL (ref 0.40–1.20)
GFR: 105.93 mL/min (ref >60.00)
GLUCOSE: 85 mg/dL (ref 70–99)
POTASSIUM: 4.1 meq/L (ref 3.5–5.1)
Sodium: 138 mEq/L (ref 135–145)
TOTAL PROTEIN: 7.2 g/dL (ref 6.0–8.3)
Total Bilirubin: 0.5 mg/dL (ref 0.2–1.2)

## 2016-11-08 LAB — LIPID PANEL
CHOLESTEROL: 180 mg/dL (ref 0–200)
HDL: 66.2 mg/dL (ref >39.00)
LDL Cholesterol: 102 mg/dL — ABNORMAL HIGH (ref 0–99)
NONHDL: 114.02
Total CHOL/HDL Ratio: 3
Triglycerides: 59 mg/dL (ref 0.0–149.0)
VLDL: 11.8 mg/dL (ref 0.0–40.0)

## 2016-11-08 LAB — HEMOGLOBIN A1C: Hgb A1c MFr Bld: 4.9 % (ref 4.6–6.5)

## 2016-11-08 MED ORDER — TYPHOID VACCINE PO CPDR: 1.0000 | DELAYED_RELEASE_CAPSULE | ORAL | 0 refills | Status: DC

## 2016-11-08 NOTE — Patient Instructions (Signed)
It was very nice to see you today!  Take care and please bring in a copy of your shot records (from when you moved to the [place]) so I can see if you need any other shots prior to your upcoming trip  We will be in touch with your labs and pap- will send you a letter with your results!

## 2016-11-08 NOTE — Progress Notes (Signed)
Pre visit review using our clinic tool,if applicable. No additional management support is needed unless otherwise documented below in the visit note.  

## 2016-11-08 NOTE — Progress Notes (Signed)
[place] at [facility] [address] [place], [place] [address] [phone]  Date:  11/08/2016   Name:  Heather Mann   DOB:  March 24, 1980   MRN:  [id number]  PCP:  Darreld Mclean, MD    Chief Complaint: Annual Exam (Immunizations for travel)   History of Present Illness:  Arial Galligan is a 36 y.o. very pleasant female patient who presents with the following:  Here today for a CPE and pap today Also, she will be traveling to the [place] for 6 weeks coming up, leaving in about 2 weeks. They are not sure if she may need some immunizations  They think that she had her Tdap- or maybe it was hep B series  when she was pregnant with her daughter- now nearly  78 yo Will be in the [place]- there is no malaria in this area according to the CDC   Last pap about 4 years ago Never had an abnl that she can recall She has CMP and CBC in Epic from January of this year- no other recent labs   She is fasting this am except for coffee  She is not on her OCP any longer- they are using condoms to prevent pregnancy LMP about 2 weeks ago Patient Active Problem List   Diagnosis Date Noted  . Breast lesion on mammography 02/13/2015  . Menorrhagia with regular cycle 02/13/2015  . Hyperlipidemia, mixed 02/13/2015  . Atypical chest pain 10/18/2014  . Neck pain 06/22/2014  . Back pain 06/22/2014  . Constipation 02/08/2014  . Anemia 08/10/2013    Past Medical History:  Diagnosis Date  . Atypical chest pain 10/18/2014  . Constipation 02/08/2014  . Preventative health care 02/08/2014    Past Surgical History:  Procedure Laterality Date  . CESAREAN SECTION  01-22-13    Social History  Substance Use Topics  . Smoking status: Never Smoker  . Smokeless tobacco: Never Used  . Alcohol use No    No family history on file.  Allergies  Allergen Reactions  . Oxycodone Hives    Medication list has been reviewed and  updated.  Current Outpatient Prescriptions on File Prior to Visit  Medication Sig Dispense Refill  . desogestrel-ethinyl estradiol (KARIVA) 0.15-0.02/0.01 MG (21/5) tablet Take 1 tablet by mouth daily. (Patient not taking: Reported on 11/08/2016) 1 Package 11  . ibuprofen (ADVIL,MOTRIN) 600 MG tablet Take 600 mg by mouth every 6 (six) hours as needed.    . loratadine (CLARITIN) 10 MG tablet Take 1 tablet (10 mg total) by mouth daily. (Patient not taking: Reported on 11/08/2016) 30 tablet 0  . meloxicam (MOBIC) 15 MG tablet Take 1 tablet (15 mg total) by mouth daily. Use if needed for chest wall pain (Patient not taking: Reported on 11/08/2016) 30 tablet 0  . Multiple Vitamins-Minerals (MULTIVITAMIN WITH MINERALS) tablet Take 1 tablet by mouth daily. (Patient not taking: Reported on 11/08/2016)    . olopatadine (PATANOL) 0.1 % ophthalmic solution Place 1 drop into both eyes 2 (two) times daily. (Patient not taking: Reported on 11/08/2016) 5 mL 1  . tizanidine (ZANAFLEX) 2 MG capsule Take 1 capsule (2 mg total) by mouth at bedtime as needed for muscle spasms. (Patient not taking: Reported on 11/08/2016) 30 capsule 0   No current facility-administered medications on file prior to visit.     Review of Systems:  As per HPI- otherwise negative.   Physical Examination: Vitals:  11/08/16 0824  BP: 111/73  Pulse: 64  Temp: 97.8 F (36.6 C)  SpO2: 100%   Vitals:   11/08/16 0824  Weight: 107 lb 3.2 oz (48.6 kg)  Height: 4\' 11"  (1.499 m)   Body mass index is 21.65 kg/m. Ideal Body Weight: Weight in (lb) to have BMI = 25: 123.5  GEN: WDWN, NAD, Non-toxic, A & O x 3, normal weight, looks well HEENT: Atraumatic, Normocephalic. Neck supple. No masses, No LAD.  Bilateral TM wnl, oropharynx normal.  PEERL,EOMI.   Ears and Nose: No external deformity. CV: RRR, No M/G/R. No JVD. No thrill. No extra heart sounds. PULM: CTA B, no wheezes, crackles, rhonchi. No retractions. No resp. distress. No  accessory muscle use. ABD: S, NT, ND. No rebound. No HSM. EXTR: No c/c/e NEURO Normal gait.  PSYCH: Normally interactive. Conversant. Not depressed or anxious appearing.  Calm demeanor.  Breast: normal exam, no masses/ dimpling/ discharge Pelvic: normal, no vaginal lesions or discharge. Uterus normal, no CMT, no adnexal tendereness or masses     Assessment and Plan: Physical exam  Screening for cervical cancer - Plan: Cytology - PAP  Screening for hyperlipidemia - Plan: Lipid panel  Screening for deficiency anemia - Plan: CBC  Screening for diabetes mellitus - Plan: Comprehensive metabolic panel, Hemoglobin A1c  Travel advice encounter - Plan: typhoid (VIVOTIF) DR capsule  Screening examination for infectious disease  Immunization due - Plan: Hepatitis B surface antibody, Hepatitis B surface antigen, Hepatitis C antibody  Here today for a physical and labs- results pending We are not sure what shots she had when she emigrated to the [place], but she remembers getting quite a few immunizations. She will bring me a copy of her report so I can see what she may need prior to her upcoming trip She does not remember taking the typhoid vaccine so will rx this today, flu shot today  Signed Lamar Blinks, MD

## 2016-11-09 LAB — HEPATITIS B SURFACE ANTIGEN: Hepatitis B Surface Ag: NONREACTIVE

## 2016-11-09 LAB — HEPATITIS B SURFACE ANTIBODY, QUANTITATIVE: Hepatitis B-Post: <5 m[IU]/mL — ABNORMAL LOW (ref >=10)

## 2016-11-09 LAB — HEPATITIS C ANTIBODY: HCV AB: NONREACTIVE

## 2016-11-10 ENCOUNTER — Encounter: Payer: Self-pay | Admitting: Family Medicine

## 2016-11-10 LAB — CYTOLOGY - PAP
Adequacy: ABSENT
Diagnosis: NEGATIVE
HPV (WINDOPATH): NOT DETECTED

## 2016-11-14 ENCOUNTER — Ambulatory Visit: Payer: Self-pay

## 2016-11-14 NOTE — Progress Notes (Unsigned)
Pre visit review using our clinic tool,if applicable. No additional management support is needed unless otherwise documented below in the visit note.  Patient scheduled for Typhoid capsules today. Patient did not bring medication in with her which was called in to   [facility] by Dr. Lorelei Pont on 11/08/16.  Husband states pharmacy states they do  not carry this medication. Advised patients that we did not keep this medication in our office and it will have to be called in to another pharmacy. [facility] in [place] where the reside and they do not administer Typhoid  immunizations there per receptionist.  [facility] pharmacy who advertises administration of Typhoid immunization. States they will be able to get medication in in approximatly 2 days. Patient can also have the immunization administered there if need be. (Capsules ordered)      Advised patient who states they would rather get it there since they live nearby. Advised to call pharmacy in 2 days to verify thar medication is in. Given phone number and address of  Pharmacy.

## 2016-11-17 ENCOUNTER — Telehealth: Payer: Self-pay

## 2016-11-17 NOTE — Telephone Encounter (Signed)
PA initiated via Covermymeds; KEY: [id number]. Awaiting determination.

## 2016-11-22 NOTE — Telephone Encounter (Signed)
Per Covermymeds- PA cancelled- PA not needed?

## 2017-02-09 ENCOUNTER — Ambulatory Visit: Payer: Self-pay | Admitting: Family Medicine

## 2017-08-22 NOTE — Progress Notes (Addendum)
[place] at [facility] [address], [place], [place] [address] [phone]  Date:  08/23/2017   Name:  Heather Mann   DOB:  [birth year]   MRN:  [id number]  PCP:  Darreld Mclean, MD    Chief Complaint: Nevus (mole on left side of face, under eye brow, been on face several years)   History of Present Illness:  Heather Mann is a 37 y.o. very pleasant female patient who presents with the following:  History of hyperlipidemia Here today with concern of a small mole under her left brow which has been present for years, it seems to be getting larger and is a bother to her cosmetically. She would like me to remove this as opposed to referral to derm which is fine  She is otherwise feeling well except she notes that she may sometimes have a hard time swallowing- however her description is vague.  She is not having trouble with any particular foods such as meats. No pain No weight loss In the end she prefers to observe her swallowing concern for now, but will alert me if not resolved soon  Wt Readings from Last 3 Encounters:  08/23/17 116 lb (52.6 kg)  11/08/16 107 lb 3.2 oz (48.6 kg)  03/23/16 114 lb 3.2 oz (51.8 kg)     Patient Active Problem List   Diagnosis Date Noted  . Breast lesion on mammography 02/13/2015  . Menorrhagia with regular cycle 02/13/2015  . Hyperlipidemia, mixed 02/13/2015  . Constipation 02/08/2014  . Anemia 08/10/2013    Past Medical History:  Diagnosis Date  . Atypical chest pain 10/18/2014  . Constipation 02/08/2014  . Preventative health care 02/08/2014    Past Surgical History:  Procedure Laterality Date  . CESAREAN SECTION  01-22-13    Social History   Tobacco Use  . Smoking status: Never Smoker  . Smokeless tobacco: Never Used  Substance Use Topics  . Alcohol use: No  . Drug use: No    No family history on file.  Allergies  Allergen Reactions  . Oxycodone Hives    Medication  list has been reviewed and updated.  No current outpatient medications on file prior to visit.   No current facility-administered medications on file prior to visit.     Review of Systems:  As per HPI- otherwise negative.   Physical Examination: Vitals:   08/23/17 1412  BP: 120/64  Pulse: 64  Resp: 16  Temp: 98 F (36.7 C)  SpO2: 100%   Vitals:   08/23/17 1412  Weight: 116 lb (52.6 kg)  Height: 4\' 11"  (1.499 m)   Body mass index is 23.43 kg/m. Ideal Body Weight: Weight in (lb) to have BMI = 25: 123.5   GEN: WDWN, NAD, Non-toxic, Alert & Oriented x 3 HEENT: Atraumatic, Normocephalic.  Ears and Nose: No external deformity. EXTR: No clubbing/cyanosis/edema NEURO: Normal gait.  PSYCH: Normally interactive. Conversant. Not depressed or anxious appearing.  Calm demeanor.  approx 54mm diameter stuck on appearing skin lesion- likely a nevus- just under left eyebrow. VC obtained.  Prepped with betadine and anesthesia with small wheal of 2% lido. My assistant retraced the area to keep it well clear of her orbit. Shaved lesion with dermablade with good result, although one small area of pigmentation remained on the skin.  Given location do not want to be more aggressive at this time Applied pressure for hemostasis and then band- aid  Assessment and  Plan: Melanocytic nevi of face - Plan: Dermatology pathology  Shaved skin lesion as above- to path to ensure benign Advised pt that if she is unhappy with small area of remaining pigmentation I am glad to re-shave for her at a later date As above, for the time being she wishes to just observe her swallowing symptoms but will let me know if persistent   Signed Lamar Blinks, MD 6/18 Received her skin pathology report, benign mole Copy of report with letter to pt

## 2017-08-23 ENCOUNTER — Encounter: Payer: Self-pay | Admitting: Family Medicine

## 2017-08-23 ENCOUNTER — Ambulatory Visit (INDEPENDENT_AMBULATORY_CARE_PROVIDER_SITE_OTHER): Payer: BLUE CROSS/BLUE SHIELD | Admitting: Family Medicine

## 2017-08-23 VITALS — BP 120/64 | HR 64 | Temp 98.0°F | Resp 16 | Ht 59.0 in | Wt 116.0 lb

## 2017-08-23 DIAGNOSIS — D2230 Melanocytic nevi of unspecified part of face: Secondary | ICD-10-CM | POA: Diagnosis not present

## 2017-08-23 NOTE — Patient Instructions (Signed)
We removed a small mole from your left brow area today I will send the sample to pathology to make sure it is benign Keep the area clean and dry today Tomorrow you can shower as usual If you have any bleeding apply firm pressure for 10- 15 minutes; if this fails to stop the bleeding please call the office If any sign of infection such as redness, pus, heat, redness please alert [place]!

## 2017-08-30 ENCOUNTER — Telehealth: Payer: Self-pay | Admitting: *Deleted

## 2017-08-30 NOTE — Telephone Encounter (Signed)
Received Dermatopathology Report results from [facility]; forwarded to provider/SLS 06/20

## 2018-04-16 NOTE — Progress Notes (Addendum)
[place] at [facility] [address], [place], [place] [address] [phone]  Date:  04/17/2018   Name:  Heather Mann   DOB:  11-03-1980   MRN:  [id number]  PCP:  Darreld Mclean, MD    Chief Complaint: Breast Exam (breast pain before menstrual cycle, itching) and Hemorrhoids   History of Present Illness:  Heather Mann is a 38 y.o. very pleasant female patient who presents with the following:  Generally healthy young woman, here today with concern about her menstrual periods She is originally from the [place] and is married to my patient Heather Mann. She notes that prior to her menses she will have breast pain and itching She has noted this for a year or so  She had a diag mammo in 1/18- she was to have a screening mammo in one year.  We can set this up for her to have done here at the med center She notes that she have heavier bleeding the first 2 days, then it will taper off  She may have some blood clots, but does not complain of excessive cramping or pain  Flu shot: done at [facility] in the fall We did full labs for her in 2018, she had a Pap done as well, this was normal Never a smoker  She notes itchy hemorrhoids they do not generally bleed  She just was not sure if anything should be done to remove this hemorrhoid, she is had it for quite some time  LMP was 2/2  Accompanied by her husband today who contributes to the history.  I do sometimes have to ask him to let the patient talk however Patient Active Problem List   Diagnosis Date Noted  . Breast lesion on mammography 02/13/2015  . Menorrhagia with regular cycle 02/13/2015  . Hyperlipidemia, mixed 02/13/2015  . Constipation 02/08/2014  . Anemia 08/10/2013    Past Medical History:  Diagnosis Date  . Atypical chest pain 10/18/2014  . Constipation 02/08/2014  . Preventative health care 02/08/2014    Past Surgical History:  Procedure Laterality Date  .  CESAREAN SECTION  01-22-13    Social History   Tobacco Use  . Smoking status: Never Smoker  . Smokeless tobacco: Never Used  Substance Use Topics  . Alcohol use: No  . Drug use: No    No family history on file.  Allergies  Allergen Reactions  . Oxycodone Hives    Medication list has been reviewed and updated.  No current outpatient medications on file prior to visit.   No current facility-administered medications on file prior to visit.     Review of Systems:  As per HPI- otherwise negative. No fever or chills  Physical Examination: Vitals:   04/17/18 0913  BP: 110/60  Pulse: 70  Resp: 16  Temp: 98.7 F (37.1 C)  SpO2: 98%   Vitals:   04/17/18 0913  Weight: 115 lb (52.2 kg)  Height: 4\' 11"  (1.499 m)   Body mass index is 23.23 kg/m. Ideal Body Weight: Weight in (lb) to have BMI = 25: 123.5  GEN: WDWN, NAD, Non-toxic, A & O x 3, normal weight, looks well  HEENT: Atraumatic, Normocephalic. Neck supple. No masses, No LAD. Ears and Nose: No external deformity. CV: RRR, No M/G/R. No JVD. No thrill. No extra heart sounds. PULM: CTA B, no wheezes, crackles, rhonchi. No retractions. No resp. distress. No accessory muscle use. ABD: S, NT, ND. No  rebound. No HSM. EXTR: No c/c/e NEURO Normal gait.  PSYCH: Normally interactive. Conversant. Not depressed or anxious appearing.  Calm demeanor.  Breast: normal exam, no masses/ dimpling/ discharge She has a small external hemorrhoid at 9:00   Assessment and Plan: Breast tenderness in female  Screening for deficiency anemia - Plan: CBC  Screening for hyperlipidemia - Plan: Lipid panel  Screening for diabetes mellitus - Plan: Comprehensive metabolic panel, Hemoglobin A1c  Screening for breast cancer - Plan: MM 3D SCREEN BREAST BILATERAL  External hemorrhoid  Menorrhagia with regular cycle - Plan: Ferritin  Concern of breast tenderness prior to menstrual cycle.  I advised her that this is a common finding and  has to do with hormonal changes.  We did offer to start her on birth control pills to decrease this symptom, and also help decrease her menstrual bleeding.  She declines at this time She does need to have a screening mammogram which I have ordered for her We will check a CBC and iron level as she has complaint of heavy menstrual bleeding  Reassured her that her hemorrhoid does not likely need to be removed surgically.  We went over conservative measures to manage it  Will plan further follow- up pending labs.  Signed Lamar Blinks, MD   Results for orders placed or performed in visit on 04/17/18  CBC  Result Value Ref Range   WBC 3.9 (L) 4.0 - 10.5 K/uL   RBC 3.88 3.87 - 5.11 Mil/uL   Platelets 357.0 150.0 - 400.0 K/uL   Hemoglobin 9.2 (L) 12.0 - 15.0 g/dL   HCT 29.1 (L) 36.0 - 46.0 %   MCV 75.2 (L) 78.0 - 100.0 fl   MCHC 31.6 30.0 - 36.0 g/dL   RDW 18.3 (H) 11.5 - 15.5 %  Comprehensive metabolic panel  Result Value Ref Range   Sodium 139 135 - 145 mEq/L   Potassium 4.3 3.5 - 5.1 mEq/L   Chloride 105 96 - 112 mEq/L   CO2 28 19 - 32 mEq/L   Glucose, Bld 77 70 - 99 mg/dL   BUN 9 6 - 23 mg/dL   Creatinine, Ser 0.63 0.40 - 1.20 mg/dL   Total Bilirubin 0.3 0.2 - 1.2 mg/dL   Alkaline Phosphatase 61 39 - 117 U/L   AST 22 0 - 37 U/L   ALT 23 0 - 35 U/L   Total Protein 6.9 6.0 - 8.3 g/dL   Albumin 4.2 3.5 - 5.2 g/dL   Calcium 9.2 8.4 - 10.5 mg/dL   GFR 106.15 >60.00 mL/min  Hemoglobin A1c  Result Value Ref Range   Hgb A1c MFr Bld 5.3 4.6 - 6.5 %  Lipid panel  Result Value Ref Range   Cholesterol 190 0 - 200 mg/dL   Triglycerides 81.0 0.0 - 149.0 mg/dL   HDL 54.00 >39.00 mg/dL   VLDL 16.2 0.0 - 40.0 mg/dL   LDL Cholesterol 119 (H) 0 - 99 mg/dL   Total CHOL/HDL Ratio 4    NonHDL 135.59   Ferritin  Result Value Ref Range   Ferritin 24.2 10.0 - 291.0 ng/mL

## 2018-04-17 ENCOUNTER — Ambulatory Visit: Payer: BLUE CROSS/BLUE SHIELD | Admitting: Family Medicine

## 2018-04-17 ENCOUNTER — Encounter: Payer: Self-pay | Admitting: Family Medicine

## 2018-04-17 VITALS — BP 110/60 | HR 70 | Temp 98.7°F | Resp 16 | Ht 59.0 in | Wt 115.0 lb

## 2018-04-17 DIAGNOSIS — K644 Residual hemorrhoidal skin tags: Secondary | ICD-10-CM

## 2018-04-17 DIAGNOSIS — N920 Excessive and frequent menstruation with regular cycle: Secondary | ICD-10-CM

## 2018-04-17 DIAGNOSIS — Z1239 Encounter for other screening for malignant neoplasm of breast: Secondary | ICD-10-CM

## 2018-04-17 DIAGNOSIS — N644 Mastodynia: Secondary | ICD-10-CM

## 2018-04-17 DIAGNOSIS — Z13220 Encounter for screening for lipoid disorders: Secondary | ICD-10-CM | POA: Diagnosis not present

## 2018-04-17 DIAGNOSIS — Z130 Encounter for screening for diseases of the blood and blood-forming organs and certain disorders involving the immune mechanism: Secondary | ICD-10-CM | POA: Diagnosis not present

## 2018-04-17 DIAGNOSIS — Z131 Encounter for screening for diabetes mellitus: Secondary | ICD-10-CM

## 2018-04-17 LAB — COMPREHENSIVE METABOLIC PANEL
ALT: 23 U/L (ref 0–35)
AST: 22 U/L (ref 0–37)
Albumin: 4.2 g/dL (ref 3.5–5.2)
Alkaline Phosphatase: 61 U/L (ref 39–117)
BUN: 9 mg/dL (ref 6–23)
CHLORIDE: 105 meq/L (ref 96–112)
CO2: 28 meq/L (ref 19–32)
CREATININE: 0.63 mg/dL (ref 0.40–1.20)
Calcium: 9.2 mg/dL (ref 8.4–10.5)
GFR: 106.15 mL/min (ref >60.00)
GLUCOSE: 77 mg/dL (ref 70–99)
Potassium: 4.3 mEq/L (ref 3.5–5.1)
SODIUM: 139 meq/L (ref 135–145)
Total Bilirubin: 0.3 mg/dL (ref 0.2–1.2)
Total Protein: 6.9 g/dL (ref 6.0–8.3)

## 2018-04-17 LAB — LIPID PANEL
CHOL/HDL RATIO: 4
Cholesterol: 190 mg/dL (ref 0–200)
HDL: 54 mg/dL (ref >39.00)
LDL CALC: 119 mg/dL — AB (ref 0–99)
NonHDL: 135.59
Triglycerides: 81 mg/dL (ref 0.0–149.0)
VLDL: 16.2 mg/dL (ref 0.0–40.0)

## 2018-04-17 LAB — CBC
HCT: 29.1 % — ABNORMAL LOW (ref 36.0–46.0)
Hemoglobin: 9.2 g/dL — ABNORMAL LOW (ref 12.0–15.0)
MCHC: 31.6 g/dL (ref 30.0–36.0)
MCV: 75.2 fl — ABNORMAL LOW (ref 78.0–100.0)
PLATELETS: 357 10*3/uL (ref 150.0–400.0)
RBC: 3.88 Mil/uL (ref 3.87–5.11)
RDW: 18.3 % — AB (ref 11.5–15.5)
WBC: 3.9 10*3/uL — ABNORMAL LOW (ref 4.0–10.5)

## 2018-04-17 LAB — HEMOGLOBIN A1C: Hgb A1c MFr Bld: 5.3 % (ref 4.6–6.5)

## 2018-04-17 LAB — FERRITIN: Ferritin: 24.2 ng/mL (ref 10.0–291.0)

## 2018-04-17 NOTE — Patient Instructions (Signed)
It was good to see you today.  We will do routine labs for you today, I will be in touch of these results We will make sure you are not anemic from heavy menstrual bleeding  Your breast tenderness prior to menstrual cycle is likely due to hormones.  If you like, we could put you on birth control pills to reduce the symptoms.  Let me know if you would like a prescription in the future  We will also set you up for screening mammogram.  These can be done in the imaging department on the ground floor of this building.  Please stop there on the way out, and schedule your appointment  You might try Tucks pads for your hemorrhoids.  Let me know if this is more bothersome

## 2018-04-23 ENCOUNTER — Encounter: Payer: Self-pay | Admitting: Family Medicine

## 2018-05-06 NOTE — Progress Notes (Signed)
[place] at [facility] [place], [place], [place] [address] [phone]  Date:  05/08/2018   Name:  Heather Mann   DOB:  Feb 07, 1981   MRN:  [id number]  PCP:  Darreld Mclean, MD    Chief Complaint: Follow-up   History of Present Illness:  Heather Mann is a 38 y.o. very pleasant female patient who presents with the following:  Here today for follow-up visit I saw her earlier this month with complaint of breast tenderness and a few other concerns At that time she noted breast tenderness prior to her menstrual cycle, she was not pregnant. Breast tenderness likely due to hormonal changes, though I did order a mammogram for her.  She has not had it done yet but they plan to schedule However we did find new significant anemia, with hemoglobin of 9.2-her hemoglobin had previously been 12.2 when last checked in 2018  I was not able to reach her by phone, so asked her to either call or come in.  Suspect her anemia is due to menorrhagia, might be helpful to put her on contraceptive pills  Non smoker Never had a stoke, cancer or blood clot, not hypertensive BP Readings from Last 3 Encounters:  05/08/18 110/60  04/17/18 110/60  08/23/17 120/64   She does not get migraine with aura though she may get headaches around the time of her menstrual cycle Her LMP was about one month ago   She has used birth control pills in the past, and did okay with them  Her husband and adorable 52-year-old daughter are with her today.  Her husband contributes to the history.  They recently went to [place] Patient Active Problem List   Diagnosis Date Noted  . Breast lesion on mammography 02/13/2015  . Menorrhagia with regular cycle 02/13/2015  . Hyperlipidemia, mixed 02/13/2015  . Constipation 02/08/2014  . Anemia 08/10/2013    Past Medical History:  Diagnosis Date  . Atypical chest pain 10/18/2014  . Constipation 02/08/2014  . Preventative  health care 02/08/2014    Past Surgical History:  Procedure Laterality Date  . CESAREAN SECTION  01-22-13    Social History   Tobacco Use  . Smoking status: Never Smoker  . Smokeless tobacco: Never Used  Substance Use Topics  . Alcohol use: No  . Drug use: No    No family history on file.  Allergies  Allergen Reactions  . Oxycodone Hives    Medication list has been reviewed and updated.  No current outpatient medications on file prior to visit.   No current facility-administered medications on file prior to visit.     Review of Systems:  As per HPI- otherwise negative. No fever chills, no chest pain or shortness of breath  Physical Examination: Vitals:   05/08/18 1517  BP: 110/60  Pulse: 88  Resp: 14  SpO2: 98%   Vitals:   05/08/18 1517  Weight: 118 lb (53.5 kg)  Height: 4\' 9"  (1.448 m)   Body mass index is 25.53 kg/m. Ideal Body Weight: Weight in (lb) to have BMI = 25: 115.3  GEN: WDWN, NAD, Non-toxic, A & O x 3, petite build, looks well HEENT: Atraumatic, Normocephalic. Neck supple. No masses, No LAD. Ears and Nose: No external deformity. CV: RRR, No M/G/R. No JVD. No thrill. No extra heart sounds. PULM: CTA B, no wheezes, crackles, rhonchi. No retractions. No resp. distress. No accessory muscle use. EXTR: No c/c/e  NEURO Normal gait.  PSYCH: Normally interactive. Conversant. Not depressed or anxious appearing.  Calm demeanor.   Results for orders placed or performed in visit on 05/08/18  POCT urine pregnancy  Result Value Ref Range   Preg Test, Ur Negative Negative    Assessment and Plan: Menorrhagia with regular cycle - Plan: norethindrone-ethinyl estradiol (MICROGESTIN,JUNEL,LOESTRIN) 1-20 MG-MCG tablet, POCT urine pregnancy  Iron deficiency anemia due to chronic blood loss - Plan: CBC  Here today with menorrhagia and associated anemia.  We discussed referral to GYN and a possible ablation, but she is not certain that her family is  complete.  As such, I suggested birth control pills.  She is okay with this idea She is expecting her menstrual period the next few days, advised her to start on the pills after her bleeding begins.  Went over how to use the pills, take 1 daily, typically will have 3 weeks of active pills and then 1 week of placebo during which she will menstruate  I advised her to please alert me if her headaches worsen with addition of birth control pills I ordered a CBC and asked her to come in for a recheck in about 3 months, this can be a lab visit only She will also take OTC iron as tolerated, every 2nd or 3rd day (tends to make her constipated)   Signed Lamar Blinks, MD

## 2018-05-08 ENCOUNTER — Ambulatory Visit: Payer: BLUE CROSS/BLUE SHIELD | Admitting: Family Medicine

## 2018-05-08 ENCOUNTER — Encounter: Payer: Self-pay | Admitting: Family Medicine

## 2018-05-08 VITALS — BP 110/60 | HR 88 | Resp 14 | Ht <= 58 in | Wt 118.0 lb

## 2018-05-08 DIAGNOSIS — N920 Excessive and frequent menstruation with regular cycle: Secondary | ICD-10-CM

## 2018-05-08 DIAGNOSIS — D500 Iron deficiency anemia secondary to blood loss (chronic): Secondary | ICD-10-CM

## 2018-05-08 MED ORDER — NORETHINDRONE ACET-ETHINYL EST 1-20 MG-MCG PO TABS: 1.0000 | ORAL_TABLET | Freq: Every day | ORAL | 4 refills | Status: DC

## 2018-05-08 NOTE — Patient Instructions (Signed)
It was good to see you today We will try birth control pills to cut down on your menstrual bleeding.  I think this will help to resolve your anemia You can start your pill pack after you get your period, which is expected soon You can start the pill any day after your bleeding begins, but it might be easiest to start on a Sunday.  That we can remember what day you start the pill pack Take your pill every day as directed on the package  You can also use an over-the-counter iron supplement a few days a week to help increase your iron stores I am hoping that the pills will decrease your headaches.  However, if your headaches get worse please let me know  Otherwise, please come in for a lab visit only in 3 to 4 months.  We will check on your blood counts at that time

## 2018-05-09 LAB — POCT URINE PREGNANCY: Preg Test, Ur: NEGATIVE

## 2018-11-07 ENCOUNTER — Other Ambulatory Visit: Payer: Self-pay

## 2018-11-09 NOTE — Progress Notes (Addendum)
[place] at [facility] [place], [place], [place] [address] [phone]  Date:  11/11/2018   Name:  Heather Mann   DOB:  1980-09-01   MRN:  [id number]  PCP:  Heather Mclean, MD    Chief Complaint: Back Pain (worse when it is cold, "feels it in her bones", upper back)   History of Present Illness:  Heather Mann is a 38 y.o. very pleasant female patient who presents with the following:  Patient with history of menorrhagia, hyperlipidemia She is originally from the [place], is married to my patient Heather Mann Here today to discuss back pain Seen by myself most recently in February She is overdue for screening mammogram-likely got delayed due to pandemic  Most recent labs in February, she was anemic at that time-this was thought due to menorrhagia.  I suggested birth control pills and prescribed same at that time.  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 Breast lesion on mammography 02/13/2015  . Menorrhagia with regular cycle 02/13/2015  . Hyperlipidemia, mixed 02/13/2015  . Constipation 02/08/2014  . Anemia 08/10/2013    Past Medical History:  Diagnosis  Date  . Atypical chest pain 10/18/2014  . Constipation 02/08/2014  . Preventative health care 02/08/2014    Past Surgical History:  Procedure Laterality Date  . CESAREAN SECTION  01-22-13    Social History   Tobacco Use  . Smoking status: Never Smoker  . Smokeless tobacco: Never Used  Substance Use Topics  . Alcohol use: No  . Drug use: No    No family history on file.  Allergies  Allergen Reactions  . Oxycodone Hives    Medication list has been reviewed and updated.  Current Outpatient Medications on File Prior to Visit  Medication Sig Dispense Refill  . norethindrone-ethinyl estradiol (MICROGESTIN,JUNEL,LOESTRIN) 1-20 MG-MCG tablet Take 1 tablet by mouth daily. 3 Package 4   No current facility-administered medications on file prior to visit.     Review of Systems:  As per HPI- otherwise negative.   Physical Examination: Vitals:   11/11/18 1552  BP: 112/70  Pulse: 68  Resp: 16  Temp: (!) 97.4 F (36.3 C)  SpO2: 99%   Vitals:   11/11/18 1552  Weight: 112 lb (50.8 kg)  Height: 4\' 9"  (1.448 m)   Body mass index is 24.24 kg/m. Ideal Body Weight: Weight in (lb) to have BMI = 25: 115.3  GEN: WDWN, NAD, Non-toxic, A & O x 3, petite build, looks well HEENT: Atraumatic, Normocephalic. Neck supple. No masses, No LAD. Ears and Nose:  No external deformity. CV: RRR, No M/G/R. No JVD. No thrill. No extra heart sounds. PULM: CTA B, no wheezes, crackles, rhonchi. No retractions. No resp. distress. No accessory muscle use. ABD: S, NT, ND, +BS EXTR: No c/c/e NEURO Normal gait.  PSYCH: Normally interactive. Conversant. Not depressed or anxious appearing.  Calm demeanor.  She got finished with his shift at work earlier today.  She now notes tenderness in her thoracic spine musculature Normal strength and DTR of all limbs   Assessment and Plan:   ICD-10-CM   1. Upper back pain  M54.9   2. Menorrhagia with regular cycle  N92.0 CBC  3. Iron deficiency anemia due  to chronic blood loss  D50.0 CBC    Ferritin  4. Screening for HIV (human immunodeficiency virus)  Z11.4 HIV Antibody (routine testing w rflx)   Here today to discuss concern about her job.  Ms. 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  This requires a lot of heavy lifting, and also exposure to very cold temperatures in the walk-in freezer.  This seems to be causing back pain  She is very petite, and I think it is reasonable for her to be transferred back to clothing which is more comfortable for her physically.  Completed her paperwork for her today  Also follow-up on her blood counts, HIV screening today Flu shot given  Follow-up: No follow-ups on file.  No orders of the defined types were placed in this encounter.  Orders Placed This Encounter  Procedures  . Flu Vaccine QUAD 6+ mos PF IM (Fluarix Quad PF)  . CBC  . Ferritin  . HIV Antibody (routine testing w rflx)    Signed Heather Blinks, MD   Received her labs, letter topt  Results for orders placed or performed in visit on 11/11/18  CBC  Result Value Ref Range   WBC 7.9 4.0 - 10.5 K/uL   RBC 4.20 3.87 - 5.11 Mil/uL   Platelets 329.0 150.0 - 400.0 K/uL   Hemoglobin 10.7 (L) 12.0 - 15.0 g/dL   HCT 33.5 (L) 36.0 - 46.0 %   MCV 79.8 78.0 - 100.0 fl   MCHC 32.0 30.0 - 36.0 g/dL   RDW 17.4 (H) 11.5 - 15.5 %  Ferritin  Result Value Ref Range   Ferritin 6.3 (L) 10.0 - 291.0 ng/mL  HIV Antibody (routine testing w rflx)  Result Value Ref Range   HIV 1&2 Ab, 4th Generation NON-REACTIVE NON-REACTI

## 2018-11-11 ENCOUNTER — Encounter: Payer: Self-pay | Admitting: Family Medicine

## 2018-11-11 ENCOUNTER — Other Ambulatory Visit: Payer: Self-pay

## 2018-11-11 ENCOUNTER — Ambulatory Visit (INDEPENDENT_AMBULATORY_CARE_PROVIDER_SITE_OTHER): Payer: BC Managed Care – PPO | Admitting: Family Medicine

## 2018-11-11 VITALS — BP 112/70 | HR 68 | Temp 97.4°F | Resp 16 | Ht <= 58 in | Wt 112.0 lb

## 2018-11-11 DIAGNOSIS — M549 Dorsalgia, unspecified: Secondary | ICD-10-CM

## 2018-11-11 DIAGNOSIS — N920 Excessive and frequent menstruation with regular cycle: Secondary | ICD-10-CM

## 2018-11-11 DIAGNOSIS — Z23 Encounter for immunization: Secondary | ICD-10-CM

## 2018-11-11 DIAGNOSIS — D500 Iron deficiency anemia secondary to blood loss (chronic): Secondary | ICD-10-CM

## 2018-11-11 DIAGNOSIS — Z114 Encounter for screening for human immunodeficiency virus [HIV]: Secondary | ICD-10-CM

## 2018-11-11 NOTE — Patient Instructions (Addendum)
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

## 2018-11-12 LAB — CBC
HCT: 33.5 % — ABNORMAL LOW (ref 36.0–46.0)
Hemoglobin: 10.7 g/dL — ABNORMAL LOW (ref 12.0–15.0)
MCHC: 32 g/dL (ref 30.0–36.0)
MCV: 79.8 fl (ref 78.0–100.0)
Platelets: 329 10*3/uL (ref 150.0–400.0)
RBC: 4.2 Mil/uL (ref 3.87–5.11)
RDW: 17.4 % — ABNORMAL HIGH (ref 11.5–15.5)
WBC: 7.9 10*3/uL (ref 4.0–10.5)

## 2018-11-12 LAB — FERRITIN: Ferritin: 6.3 ng/mL — ABNORMAL LOW (ref 10.0–291.0)

## 2018-11-12 LAB — HIV ANTIBODY (ROUTINE TESTING W REFLEX): HIV 1&2 Ab, 4th Generation: NONREACTIVE

## 2018-11-12 NOTE — Addendum Note (Signed)
Addended by: Lamar Blinks C on: 11/12/2018 02:15 PM   Modules accepted: Orders

## 2018-11-20 ENCOUNTER — Telehealth: Payer: Self-pay

## 2018-11-20 NOTE — Telephone Encounter (Signed)
Copied from [place] [id number]. Topic: General - Other >> Nov 19, 2018  8:51 AM Reyne Dumas L wrote: Reason for CRM:   Pt's spouse calling.  States that wife's work note is not working at her job.  States that employer is needing clarification on how much pt can lift and what type of work can pt do if not bending.

## 2018-11-21 NOTE — Telephone Encounter (Signed)
Dr. Lorelei Pont the patient's husband is bringing by the packet on Monday. 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

## 2018-11-21 NOTE — Telephone Encounter (Signed)
ok 

## 2019-06-03 ENCOUNTER — Other Ambulatory Visit: Payer: Self-pay

## 2019-06-17 ENCOUNTER — Other Ambulatory Visit: Payer: Self-pay

## 2019-06-17 NOTE — Progress Notes (Signed)
[place] at [facility] [place], [place], [place] [address] [phone]  Date:  06/18/2019   Name:  Heather Mann   DOB:  1980/11/10   MRN:  [id number]  PCP:  Darreld Mclean, MD    Chief Complaint: Breast Pain (itching of breast, painful) and Hemorrhoids   History of Present Illness:  Heather Mann is a 39 y.o. very pleasant female patient who presents with the following:  Patient with anemia, hyperlipidemia Here today with concern of pain in her breasts and also her buttocks Last seen by myself August of last year with upper back pain She is originally from the [place], married to Paul  Due for routine screening labs- however lab closed early today due to staffing so pt will have to come back another time  Mammogram in 2018-this was normal, but she was asked to follow-up in 1 year for screening  She has noted her bilateral breasts itching for about one month Her breasts may ache when she has her cycle only, but otherwise no pain or other symptoms She has been applying an over-the-counter antiitch cream that she got from the dollar store, this does not seem to be helping  LMP was 4/5 Per patient there is no chance of pregnancy as she and her husband had not had sex in several years. Their daughter is 46 years old  She is also having increasing issues with hemorrhoids -she tends to have discomfort and sometimes itching.  She is interested in possibly having further treatment of her hemorrhoids Patient Active Problem List   Diagnosis Date Noted  . Breast lesion on mammography 02/13/2015  . Menorrhagia with regular cycle 02/13/2015  . Hyperlipidemia, mixed 02/13/2015  . Constipation 02/08/2014  . Anemia 08/10/2013    Past Medical History:  Diagnosis Date  . Atypical chest pain 10/18/2014  . Constipation 02/08/2014  . Preventative health care 02/08/2014    Past Surgical History:  Procedure Laterality Date  .  CESAREAN SECTION  01-22-13    Social History   Tobacco Use  . Smoking status: Never Smoker  . Smokeless tobacco: Never Used  Substance Use Topics  . Alcohol use: No  . Drug use: No    No family history on file.  Allergies  Allergen Reactions  . Oxycodone Hives    Medication list has been reviewed and updated.  No current outpatient medications on file prior to visit.   No current facility-administered medications on file prior to visit.    Review of Systems:  As per HPI- otherwise negative.   Physical Examination: Vitals:   06/18/19 1553  BP: 110/72  Pulse: 65  Resp: 16  Temp: 98.3 F (36.8 C)  SpO2: 100%   Vitals:   06/18/19 1553  Weight: 121 lb (54.9 kg)  Height: 4\' 9"  (1.448 m)   Body mass index is 26.18 kg/m. Ideal Body Weight: Weight in (lb) to have BMI = 25: 115.3  GEN: no acute distress.  Petite build, looks well HEENT: Atraumatic, Normocephalic.  Ears and Nose: No external deformity. CV: RRR, No M/G/R. No JVD. No thrill. No extra heart sounds. PULM: CTA B, no wheezes, crackles, rhonchi. No retractions. No resp. distress. No accessory muscle use. ABD: S, NT, ND EXTR: No c/c/e PSYCH: Normally interactive. Conversant.  Breasts show no masses or dimpling.  The skin of the nipples is dry bilaterally, suspect this may be why she is having itching She does have a  small external hemorrhoid at 6:00   Assessment and Plan: Screening for deficiency anemia - Plan: CANCELED: CBC  Screening for hyperlipidemia - Plan: CANCELED: Lipid panel  Iron deficiency anemia due to chronic blood loss - Plan: CANCELED: CBC  Screening for diabetes mellitus - Plan: CANCELED: Comprehensive metabolic panel, CANCELED: Hemoglobin A1c  Screening for thyroid disorder - Plan: CANCELED: TSH  Concern about appearance of breast - Plan: CANCELED: POCT urine pregnancy  Encounter for screening mammogram for malignant neoplasm of breast - Plan: MM 3D SCREEN BREAST  BILATERAL  Hemorrhoids, unspecified hemorrhoid type - Plan: Ambulatory referral to General Surgery  Here today with concern of breast itching.  I think this is likely due to dry skin, I advised her to stop using antiitch cream and change to a mild moisturizer.  She is overdue for a screening mammogram so this is ordered today We will also refer her to see general surgery to discuss small but bothersome hemorrhoids  Had to cancel labs as above because her laboratory is closed.  We will do this at a later date  This visit occurred during the SARS-CoV-2 public health emergency.  Safety protocols were in place, including screening questions prior to the visit, additional usage of staff PPE, and extensive cleaning of exam room while observing appropriate contact time as indicated for disinfecting solutions.    Signed Lamar Blinks, MD

## 2019-06-18 ENCOUNTER — Ambulatory Visit: Payer: BC Managed Care – PPO | Admitting: Family Medicine

## 2019-06-18 ENCOUNTER — Encounter: Payer: Self-pay | Admitting: Family Medicine

## 2019-06-18 ENCOUNTER — Other Ambulatory Visit: Payer: Self-pay

## 2019-06-18 VITALS — BP 110/72 | HR 65 | Temp 98.3°F | Resp 16 | Ht <= 58 in | Wt 121.0 lb

## 2019-06-18 DIAGNOSIS — Z131 Encounter for screening for diabetes mellitus: Secondary | ICD-10-CM

## 2019-06-18 DIAGNOSIS — R4689 Other symptoms and signs involving appearance and behavior: Secondary | ICD-10-CM

## 2019-06-18 DIAGNOSIS — Z130 Encounter for screening for diseases of the blood and blood-forming organs and certain disorders involving the immune mechanism: Secondary | ICD-10-CM | POA: Diagnosis not present

## 2019-06-18 DIAGNOSIS — Z1329 Encounter for screening for other suspected endocrine disorder: Secondary | ICD-10-CM

## 2019-06-18 DIAGNOSIS — D500 Iron deficiency anemia secondary to blood loss (chronic): Secondary | ICD-10-CM | POA: Diagnosis not present

## 2019-06-18 DIAGNOSIS — Z13220 Encounter for screening for lipoid disorders: Secondary | ICD-10-CM | POA: Diagnosis not present

## 2019-06-18 DIAGNOSIS — K649 Unspecified hemorrhoids: Secondary | ICD-10-CM

## 2019-06-18 DIAGNOSIS — Z1231 Encounter for screening mammogram for malignant neoplasm of breast: Secondary | ICD-10-CM

## 2019-06-18 NOTE — Patient Instructions (Signed)
We will get you set up for a mammogram at [facility] as you requested  Please stop using the "anti-itch" cream on your breasts as this may be irritating. Please instead use a gentle moisturizer such as cetaphil or aquaphor as needed for dry skin   I am going to have you see general surgery to discuss your hemorrhoid concerns

## 2019-07-15 ENCOUNTER — Other Ambulatory Visit: Payer: Self-pay | Admitting: Family Medicine

## 2019-07-15 DIAGNOSIS — R4689 Other symptoms and signs involving appearance and behavior: Secondary | ICD-10-CM

## 2019-07-15 NOTE — Progress Notes (Signed)
Fax from [facility] imaging that pt has a breast concern, they would like order for diagnostic mammo,  Placed order now

## 2019-09-29 ENCOUNTER — Ambulatory Visit: Payer: BC Managed Care – PPO | Admitting: Medical

## 2019-09-29 DIAGNOSIS — Z0289 Encounter for other administrative examinations: Secondary | ICD-10-CM

## 2019-11-19 ENCOUNTER — Telehealth: Payer: Self-pay | Admitting: Family Medicine

## 2019-11-19 DIAGNOSIS — K649 Unspecified hemorrhoids: Secondary | ICD-10-CM

## 2019-11-19 NOTE — Telephone Encounter (Signed)
Caller: Shanon Brow (husband Call back # [phone]   Patient would like a referral to get hemorrhoids removed

## 2019-11-19 NOTE — Telephone Encounter (Signed)
I have pended gi referral.

## 2019-11-25 ENCOUNTER — Encounter: Payer: Self-pay | Admitting: Gastroenterology

## 2020-01-20 ENCOUNTER — Encounter: Payer: Self-pay | Admitting: Gastroenterology

## 2020-01-20 ENCOUNTER — Ambulatory Visit (INDEPENDENT_AMBULATORY_CARE_PROVIDER_SITE_OTHER): Payer: BC Managed Care – PPO | Admitting: Gastroenterology

## 2020-01-20 VITALS — BP 102/60 | HR 76 | Ht <= 58 in | Wt 111.1 lb

## 2020-01-20 DIAGNOSIS — K644 Residual hemorrhoidal skin tags: Secondary | ICD-10-CM

## 2020-01-20 DIAGNOSIS — K648 Other hemorrhoids: Secondary | ICD-10-CM

## 2020-01-20 NOTE — Patient Instructions (Addendum)
If you are age 39 or older, your body mass index should be between 23-30. Your Body mass index is 24.91 kg/m. If this is out of the aforementioned range listed, please consider follow up with your Primary Care Provider.  If you are age 19 or younger, your body mass index should be between 19-25. Your Body mass index is 24.91 kg/m. If this is out of the aformentioned range listed, please consider follow up with your Primary Care Provider.   Please start taking citrucel (orange flavored) powder fiber supplement.  This may cause some bloating at first but that usually goes away. Begin with a small spoonful and work your way up to a large, heaping spoonful daily over a week.  You have been scheduled for an appointment with ________ at [facility]. Your appointment is on _________at _________. Please arrive at _________ for registration. Make certain to bring a list of current medications, including any over the counter medications or vitamins. Also bring your co-pay if you have one as well as your insurance cards. [facility] is located at [address]. Should you need to reschedule your appointment, please contact them at [phone].   Thank you for entrusting me with your care and choosing [facility].  Dr Ardis Hughs

## 2020-01-20 NOTE — Progress Notes (Signed)
HPI: This is a very pleasant 39 year old woman who was referred to me by Copland, Gay Filler, MD  to evaluate hemorrhoids, constipation.    She has been bothered by hemorrhoids for 2 to 3 years.  She can feel a bump on her anus.  It will intermittently be uncomfortable, it would intermittently itch.  It never bleeds.  She has been bothered by constipation for many many years.  Previously she would move her bowels once a week and only with a lot of straining.  She started drinking some smoothies and eating more vegetables and now she moves her bowels every 3 to 4 days but still has to strain quite a bit.  Colon cancer does not run in her family.  Her weight is overall stable.  Review of systems: Pertinent positive and negative review of systems were noted in the above HPI section. All other review negative.   Past Medical History:  Diagnosis Date  . Atypical chest pain 10/18/2014  . Constipation 02/08/2014  . Preventative health care 02/08/2014    Past Surgical History:  Procedure Laterality Date  . CESAREAN SECTION  01-22-13    No current outpatient medications on file.   No current facility-administered medications for this visit.    Allergies as of 01/20/2020 - Review Complete 01/20/2020  Allergen Reaction Noted  . Oxycodone Hives 12/19/2013    Family History  Problem Relation Age of Onset  . Hypertension Mother     Social History   Socioeconomic History  . Marital status: Married    Spouse name: Not on file  . Number of children: 1  . Years of education: Not on file  . Highest education level: Not on file  Occupational History  . Occupation: Therapist, art  Tobacco Use  . Smoking status: Never Smoker  . Smokeless tobacco: Never Used  Vaping Use  . Vaping Use: Never used  Substance and Sexual Activity  . Alcohol use: No  . Drug use: No  . Sexual activity: Yes    Partners: Male  Other Topics Concern  . Not on file  Social History Narrative  . Not on file    Social Determinants of Health   Financial Resource Strain:   . Difficulty of Paying Living Expenses: Not on file  Food Insecurity:   . Worried About Charity fundraiser in the Last Year: Not on file  . Ran Out of Food in the Last Year: Not on file  Transportation Needs:   . Lack of Transportation (Medical): Not on file  . Lack of Transportation (Non-Medical): Not on file  Physical Activity:   . Days of Exercise per Week: Not on file  . Minutes of Exercise per Session: Not on file  Stress:   . Feeling of Stress : Not on file  Social Connections:   . Frequency of Communication with Friends and Family: Not on file  . Frequency of Social Gatherings with Friends and Family: Not on file  . Attends Religious Services: Not on file  . Active Member of Clubs or Organizations: Not on file  . Attends Archivist Meetings: Not on file  . Marital Status: Not on file  Intimate Partner Violence:   . Fear of Current or Ex-Partner: Not on file  . Emotionally Abused: Not on file  . Physically Abused: Not on file  . Sexually Abused: Not on file     Physical Exam: BP 102/60 (BP Location: Left Arm, Patient Position: Sitting, Cuff Size: Normal)  Pulse 76   Ht 4\' 8"  (1.422 m) Comment: height measured without shoes  Wt 111 lb 2 oz (50.4 kg)   LMP 01/07/2020   BMI 24.91 kg/m  Constitutional: generally well-appearing Psychiatric: alert and oriented x3 Eyes: extraocular movements intact Mouth: oral pharynx moist, no lesions Neck: supple no lymphadenopathy Cardiovascular: heart regular rate and rhythm Lungs: clear to auscultation bilaterally Abdomen: soft, nontender, nondistended, no obvious ascites, no peritoneal signs, normal bowel sounds Extremities: no lower extremity edema bilaterally Skin: no lesions on visible extremities Rectal examination with female assistant in the room: Obvious deflated 1 cm external anal hemorrhoid anterior midline.  By a anoscopy she clearly has  internal hemorrhoid as well.  Assessment and plan: 39 y.o. female with internal and external hemorrhoids, chronic constipation  I recommended that she start fiber supplement on a daily basis to help her chronic constipation.  This in turn can help resolve her hemorrhoids both the internal and the external.  I offered her a referral to one of my partners who can do internal hemorrhoid banding.  She is not interested in that.  She would much rather see a surgery doctor who could advise her on external hemorrhoid therapies.  I did explain that banding the internal hemorrhoids might cause the external hemorrhoids to shrink as well.  She is still not interested.  She is quite committed to getting the deflated hemorrhoidal tissue at her anus removed.  We will arrange for [facility] [place] [facility] referral.   Please see the "Patient Instructions" section for addition details about the plan.   Owens Loffler, MD [facility] 01/20/2020, 9:41 AM  Cc: Darreld Mclean, MD  Total time on date of encounter was 45  minutes (this included time spent preparing to see the patient reviewing records; obtaining and/or reviewing separately obtained history; performing a medically appropriate exam and/or evaluation; counseling and educating the patient and family if present; ordering medications, tests or procedures if applicable; and documenting clinical information in the health record).

## 2020-02-24 ENCOUNTER — Encounter: Payer: BC Managed Care – PPO | Admitting: Gastroenterology

## 2020-03-17 ENCOUNTER — Ambulatory Visit: Payer: Self-pay | Admitting: Surgery

## 2020-03-17 ENCOUNTER — Encounter: Payer: Self-pay | Admitting: Surgery

## 2020-03-17 DIAGNOSIS — K648 Other hemorrhoids: Secondary | ICD-10-CM | POA: Insufficient documentation

## 2020-03-17 NOTE — H&P (Signed)
Alva Garnet Appointment: 03/17/2020 4:00 PM Location: [facility] Patient #: [id number] DOB: [birth year] Married / Language: English / Race: Asian Female  History of Present Illness Ardeth Sportsman MD; 03/17/2020 4:36 PM) The patient is a 40 year old female who presents with hemorrhoids. Note for "Hemorrhoids": ` ` ` Patient sent for surgical consultation at the request of Dr Wendall Papa  Chief Complaint: Hemorrhoids ` ` The patient is a woman that struggle with hemorrhoids and constipation for many years. At least the past 4 years. Had a C-section in 2014 when she lived in [place] He went to gastroenterology for evaluation. Saw Dr. Christella Hartigan. Fiber regimen recommended. Offered internal banding. Surgical consultation requested by the patient since she has external hemorrhoids bothering her the most.  Patient originally [place] Speaks English relatively well. Had her husband for the latter part of the visit. She notes she gets burning and itching and irritation. She moves her bowels maybe once a week. She's tried laxatives in the past likely gives her diarrhea since she tries to have fiber in her diet.  No personal nor family history of GI/colon cancer, inflammatory bowel disease, irritable bowel syndrome, allergy such as Celiac Sprue, dietary/dairy problems, colitis, ulcers nor gastritis. No recent sick contacts/gastroenteritis. No travel outside the country. No changes in diet. No dysphagia to solids or liquids. No significant heartburn or reflux. No melena, hematemesis, coffee ground emesis. No evidence of prior gastric/peptic ulceration. (Review of systems as stated in this history (HPI) or in the review of systems. Otherwise all other 12 point ROS are negative) ` ` ###########################################`  This patient encounter took 35 minutes today to perform the following: obtain history, perform exam, review outside records,  interpret tests & imaging, counsel the patient on their diagnosis; and, document this encounter, including findings & plan in the electronic health record (EHR).   Past Surgical History Micheal Likens, CMA; 03/17/2020 4:05 PM) Cesarean Section - 1  Diagnostic Studies History (Chanel Lonni Fix, CMA; 03/17/2020 4:05 PM) Colonoscopy never Mammogram 1-3 years ago Pap Smear >5 years ago  Allergies (Chanel Lonni Fix, CMA; 03/17/2020 4:05 PM) No Known Drug Allergies [03/17/2020]: Allergies Reconciled  Medication History (Chanel Lonni Fix, CMA; 03/17/2020 4:05 PM) No Current Medications Medications Reconciled  Social History Ardeth Sportsman, MD; 03/17/2020 4:20 PM) Originally from [place]  Pregnancy / Birth History Micheal Likens, CMA; 03/17/2020 4:05 PM) Age at menarche 13 years. Contraceptive History Oral contraceptives. Gravida 1 Length (months) of breastfeeding 7-12 Maternal age 21-35 Para 1 Regular periods  Other Problems (Chanel Lonni Fix, CMA; 03/17/2020 4:05 PM) Chest pain Hemorrhoids Migraine Headache     Review of Systems (Chanel Nolan CMA; 03/17/2020 4:05 PM) General Not Present- Appetite Loss, Chills, Fatigue, Fever, Night Sweats, Weight Gain and Weight Loss. Skin Present- Dryness. Not Present- Change in Wart/Mole, Hives, Jaundice, New Lesions, Non-Healing Wounds, Rash and Ulcer. HEENT Not Present- Earache, Hearing Loss, Hoarseness, Nose Bleed, Oral Ulcers, Ringing in the Ears, Seasonal Allergies, Sinus Pain, Sore Throat, Visual Disturbances, Wears glasses/contact lenses and Yellow Eyes. Respiratory Not Present- Bloody sputum, Chronic Cough, Difficulty Breathing, Snoring and Wheezing. Breast Not Present- Breast Mass, Breast Pain, Nipple Discharge and Skin Changes. Cardiovascular Present- Chest Pain. Not Present- Difficulty Breathing Lying Down, Leg Cramps, Palpitations, Rapid Heart Rate, Shortness of Breath and Swelling of Extremities. Gastrointestinal Present- Abdominal Pain,  Bloating, Change in Bowel Habits, Constipation, Excessive gas, Hemorrhoids, Indigestion, Rectal Pain and Vomiting. Not Present- Bloody Stool, Chronic diarrhea, Difficulty Swallowing, Gets full quickly at meals and  Nausea. Female Genitourinary Present- Frequency and Nocturia. Not Present- Painful Urination, Pelvic Pain and Urgency. Musculoskeletal Not Present- Back Pain, Joint Pain, Joint Stiffness, Muscle Pain, Muscle Weakness and Swelling of Extremities. Neurological Present- Headaches. Not Present- Decreased Memory, Fainting, Numbness, Seizures, Tingling, Tremor, Trouble walking and Weakness. Psychiatric Not Present- Anxiety, Bipolar, Change in Sleep Pattern, Depression, Fearful and Frequent crying. Endocrine Not Present- Cold Intolerance, Excessive Hunger, Hair Changes, Heat Intolerance, Hot flashes and New Diabetes. Hematology Not Present- Blood Thinners, Easy Bruising, Excessive bleeding, Gland problems, HIV and Persistent Infections.  Vitals (Chanel Nolan CMA; 03/17/2020 4:06 PM) 03/17/2020 4:05 PM Weight: 115.5 lb Height: 59in Body Surface Area: 1.46 m Body Mass Index: 23.33 kg/m  Temp.: 98.47F  Pulse: 84 (Regular)         Physical Exam Adin Hector MD; 03/17/2020 4:23 PM)  General Mental Status-Alert. General Appearance-Not in acute distress, Not Sickly. Orientation-Oriented X3. Hydration-Well hydrated. Voice-Normal.  Integumentary Global Assessment Upon inspection and palpation of skin surfaces of the - Axillae: non-tender, no inflammation or ulceration, no drainage. and Distribution of scalp and body hair is normal. General Characteristics Temperature - normal warmth is noted.  Head and Neck Head-normocephalic, atraumatic with no lesions or palpable masses. Face Global Assessment - atraumatic, no absence of expression. Neck Global Assessment - no abnormal movements, no bruit auscultated on the right, no bruit auscultated on the left, no  decreased range of motion, non-tender. Trachea-midline. Thyroid Gland Characteristics - non-tender.  Eye Eyeball - Left-Extraocular movements intact, No Nystagmus - Left. Eyeball - Right-Extraocular movements intact, No Nystagmus - Right. Cornea - Left-No Hazy - Left. Cornea - Right-No Hazy - Right. Sclera/Conjunctiva - Left-No scleral icterus, No Discharge - Left. Sclera/Conjunctiva - Right-No scleral icterus, No Discharge - Right. Pupil - Left-Direct reaction to light normal. Pupil - Right-Direct reaction to light normal.  ENMT Ears Pinna - Left - no drainage observed, no generalized tenderness observed. Pinna - Right - no drainage observed, no generalized tenderness observed. Nose and Sinuses External Inspection of the Nose - no destructive lesion observed. Inspection of the nares - Left - quiet respiration. Inspection of the nares - Right - quiet respiration. Mouth and Throat Lips - Upper Lip - no fissures observed, no pallor noted. Lower Lip - no fissures observed, no pallor noted. Nasopharynx - no discharge present. Oral Cavity/Oropharynx - Tongue - no dryness observed. Oral Mucosa - no cyanosis observed. Hypopharynx - no evidence of airway distress observed.  Chest and Lung Exam Inspection Movements - Normal and Symmetrical. Accessory muscles - No use of accessory muscles in breathing. Palpation Palpation of the chest reveals - Non-tender. Auscultation Breath sounds - Normal and Clear.  Cardiovascular Auscultation Rhythm - Regular. Murmurs & Other Heart Sounds - Auscultation of the heart reveals - No Murmurs and No Systolic Clicks.  Abdomen Inspection Inspection of the abdomen reveals - No Visible peristalsis and No Abnormal pulsations. Umbilicus - No Bleeding, No Urine drainage. Palpation/Percussion Palpation and Percussion of the abdomen reveal - Soft, Non Tender, No Rebound tenderness, No Rigidity (guarding) and No Cutaneous hyperesthesia. Note:  Abdomen soft. Not severely distended. No distasis recti. No umbilical or other anterior abdominal wall hernias  Female Genitourinary Sexual Maturity Tanner 5 - Adult hair pattern. Note: No vaginal bleeding nor discharge  Rectal Note: Right anterior internal hemorrhoid inflamed with some partial prolapse and external component going towards midline raphae.  Perianal skin clean with good hygiene. No pruritis ani. No pilonidal disease. No fissure. No abscess/fistula. Normal sphincter tone.  No condyloma warts.  Tolerates digital and anoscopic rectal exam. Irritated internal hemorrhoids right posterior and left lateral with some inflamed crypts. Some mild prolapse with straining. Grade 2/3. Exam done with assistance of female Medical Assistant in the room.  Peripheral Vascular Upper Extremity Inspection - Left - No Cyanotic nailbeds - Left, Not Ischemic. Inspection - Right - No Cyanotic nailbeds - Right, Not Ischemic.  Neurologic Neurologic evaluation reveals -normal attention span and ability to concentrate, able to name objects and repeat phrases. Appropriate fund of knowledge , normal sensation and normal coordination. Mental Status Affect - not angry, not paranoid. Cranial Nerves-Normal Bilaterally. Gait-Normal.  Neuropsychiatric Mental status exam performed with findings of-able to articulate well with normal speech/language, rate, volume and coherence, thought content normal with ability to perform basic computations and apply abstract reasoning and no evidence of hallucinations, delusions, obsessions or homicidal/suicidal ideation.  Musculoskeletal Global Assessment Spine, Ribs and Pelvis - no instability, subluxation or laxity. Right Upper Extremity - no instability, subluxation or laxity.  Lymphatic Head & Neck  General Head & Neck Lymphatics: Bilateral - Description - No Localized lymphadenopathy. Axillary  General Axillary Region: Bilateral -  Description - No Localized lymphadenopathy. Femoral & Inguinal  Generalized Femoral & Inguinal Lymphatics: Left - Description - No Localized lymphadenopathy. Right - Description - No Localized lymphadenopathy.    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  I think she would benefit from hemorrhoidal ligation and pexy. I think these are too irritated and sensitive for her to tolerate banding. Hemorrhoidectomy of prolapsing material. Right anterior at the very least  Had discussion with the patient and with her husband. Somewhat of a language and comprehension barrier for her but I think her husband helped her explanted with me.  I recommended fiber supplements. 2-4 doses a day to correct her severe constipation.  I suspect she would still need hemorrhoid surgery since she is having bleeding and irritation.  She is interested hemorrhoid surgery but wishes to see her family in the [place] first.  Probably will be several months. She will call us and let us know.  Current Plans ANOSCOPY, DIAGNOSTIC [id number]) Pt Education - [facility] Hemorrhoids (Kysen Wetherington): discussed with patient and provided information. Pt Education - [facility] Pelvic Floor Exercises (Kegels) and Dysfunction HCI (Denitra Donaghey) Pt Education - Pamphlet Given - The Hemorrhoid Book: discussed with patient and provided information.  PROLAPSED INTERNAL HEMORRHOIDS, GRADE 2 (K64.1)   EXTERNAL HEMORRHOIDS WITH COMPLICATION (0000000)   ENCOUNTER FOR PREOPERATIVE EXAMINATION FOR GENERAL SURGICAL PROCEDURE (Z01.818)  Current Plans You are being scheduled for surgery- Our schedulers will call you.  You should hear from our office's scheduling department within 5 working days about the location, date, and time of surgery. We try to make accommodations for  patient's preferences in scheduling surgery, but sometimes the OR schedule or the surgeon's schedule prevents [place] from making those accommodations.  If you have not heard from our office [phone]) in 5 working days, call the office and ask for your surgeon's nurse.  If you have other questions about your diagnosis, plan, or surgery, call the office and ask for your surgeon's nurse.  The anatomy & physiology of the anorectal region was discussed. The pathophysiology of hemorrhoids and differential diagnosis was discussed. Natural history risks without surgery was discussed. I stressed the importance of a bowel regimen to have daily soft  bowel movements to minimize progression of disease. Interventions such as sclerotherapy & banding were discussed.  [name] symptoms are not adequately controlled by medicines and other non-operative treatments. I feel the risks & problems of no surgery outweigh the operative risks; therefore, I recommended surgery to treat the hemorrhoids by ligation, pexy, and possible resection.  Risks such as bleeding, infection, urinary difficulties, need for further treatment, heart attack, death, and other risks were discussed. I noted a good likelihood this will help address the problem. Goals of post-operative recovery were discussed as well. Possibility that this will not correct all symptoms was explained. Post-operative pain, bleeding, constipation, and other problems after surgery were discussed. We will work to minimize complications. Educational handouts further explaining the pathology, treatment options, and bowel regimen were given as well. Questions were answered. The patient expresses understanding & wishes to proceed with surgery.  Pt Education - [facility] Rectal Prep for Anorectal outpatient/office surgery: discussed with patient and provided information. Pt Education - [facility] Rectal Surgery HCI (Pairlee Sawtell): discussed with patient and provided  information.  CHRONIC CONSTIPATION (K59.09)  Current Plans Pt Education - [facility] Constipation (AT) Pt Education - [facility] Good Bowel Health (Latika Kronick)  Adin Hector, MD, FACS, MASCRS Gastrointestinal and Minimally Invasive Surgery  [facility] [address], [place], [place] [address] [phone] Fax [phone] Main/Paging  CONTACT INFORMATION: Weekday (9AM-5PM) concerns: Call [facility] main office at [phone] Weeknight (5PM-9AM) or Weekend/Holiday concerns: Check [email or web address] for General Surgery [facility] coverage (Please, do not use SecureChat as it is not reliable communication to operating surgeons for immediate patient care)

## 2020-12-31 ENCOUNTER — Other Ambulatory Visit: Payer: Self-pay

## 2020-12-31 ENCOUNTER — Ambulatory Visit: Payer: BC Managed Care – PPO | Admitting: Family

## 2020-12-31 VITALS — BP 122/58 | HR 60 | Temp 98.7°F | Resp 16 | Wt 110.0 lb

## 2020-12-31 DIAGNOSIS — R0989 Other specified symptoms and signs involving the circulatory and respiratory systems: Secondary | ICD-10-CM | POA: Diagnosis not present

## 2020-12-31 MED ORDER — OMEPRAZOLE 40 MG PO CPDR: 40.0000 mg | DELAYED_RELEASE_CAPSULE | Freq: Every day | ORAL | 3 refills | Status: AC

## 2020-12-31 NOTE — Patient Instructions (Signed)
Please begin omeprazole 40mg  once daily. Work on reflux diet changes as we discussed and as outlined in this packet. Take your fiber supplement once daily to prevent constipation and be sure to drink plenty of water.  Call if new/worsening symptoms.

## 2020-12-31 NOTE — Progress Notes (Signed)
Subjective:   By signing my name below, I, Heather Mann, attest that this documentation has been prepared under the direction and in the presence of [name], NP 12/31/2020     Patient ID: Heather Mann, female    DOB: 1980-08-27, 40 y.o.   MRN: [id number]  Chief Complaint  Patient presents with   Choking    Patient reports having trouble swallowing "feels like choking"   Hemorrhoids    Complains of hemorrhoids    HPI Patient is in today for an office visit. She is accompanied by her husband.  Acid Reflux- She reports that when she lies down at night, there is always a feeling of something being stuck in her throat and she has to swallow her saliva several times before the feeling dissipates. She denies difficulty or pain swallowing- it just feels like a ball is "stuck."  She also experiences right abdominal pain and has about 3 bowel movements weekly. She was diagnosed with hemorrhoids when she went to the gastroenterologist. She was recommended to begin a daily fiber supplement to manage the hemorrhoids but refuses to use it during her menstrual cycle.  Colonoscopy- She wanted to find out the right age to get your first colonoscopy.  Past Medical History:  Diagnosis Date   Arrest of descent, delivered, current hospitalization 01/22/2013   Formatting of this note might be different from the original. S/p PLTCS by LBR on 01/22/13 Female "Heather Mann"   Atypical chest pain 10/18/2014   Constipation 02/08/2014   Pregnancy 06/26/2012   Preventative health care 02/08/2014    Past Surgical History:  Procedure Laterality Date   CESAREAN SECTION  01-22-13    Family History  Problem Relation Age of Onset   Hypertension Mother     Social History   Socioeconomic History   Marital status: Married    Spouse name: Not on file   Number of children: 1   Years of education: Not on file   Highest education level: Not on file  Occupational History   Occupation: Customer Service   Tobacco Use   Smoking status: Never   Smokeless tobacco: Never  Vaping Use   Vaping Use: Never used  Substance and Sexual Activity   Alcohol use: No   Drug use: No   Sexual activity: Yes    Partners: Male  Other Topics Concern   Not on file  Social History Narrative   Originally from [place] Strain: Not on file  Food Insecurity: Not on file  Transportation Needs: Not on file  Physical Activity: Not on file  Stress: Not on file  Social Connections: Not on file  Intimate Partner Violence: Not on file    No outpatient medications prior to visit.   No facility-administered medications prior to visit.    Allergies  Allergen Reactions   Oxycodone Hives    Review of Systems  Constitutional:  Negative for fever.  HENT:  Negative for ear pain and hearing loss.        (-)nystagmus (-)adenopathy  Eyes:  Negative for blurred vision.  Respiratory:  Negative for cough, shortness of breath and wheezing.   Cardiovascular:  Negative for chest pain and leg swelling.  Gastrointestinal:  Positive for abdominal pain (right side) and constipation. Negative for blood in stool, diarrhea, nausea and vomiting.  Genitourinary:  Negative for dysuria and frequency.  Musculoskeletal:  Negative for joint pain and myalgias.  Skin:  Negative for rash.  Neurological:  Negative for headaches.  Psychiatric/Behavioral:  Negative for depression. The patient is not nervous/anxious.       Objective:    Physical Exam Constitutional:      General: She is not in acute distress.    Appearance: Normal appearance. She is not ill-appearing.  HENT:     Head: Normocephalic and atraumatic.     Right Ear: External ear normal.     Left Ear: External ear normal.  Eyes:     Extraocular Movements: Extraocular movements intact.     Pupils: Pupils are equal, round, and reactive to light.  Cardiovascular:     Rate and Rhythm: Normal rate and regular  rhythm.     Pulses: Normal pulses.     Heart sounds: Normal heart sounds. No murmur heard. Pulmonary:     Effort: Pulmonary effort is normal. No respiratory distress.     Breath sounds: Normal breath sounds. No wheezing or rhonchi.  Abdominal:     General: Bowel sounds are normal. There is no distension.     Palpations: Abdomen is soft.     Tenderness: There is no abdominal tenderness. There is no guarding or rebound.  Musculoskeletal:     Cervical back: Neck supple.  Lymphadenopathy:     Cervical: No cervical adenopathy.  Skin:    General: Skin is warm and dry.  Neurological:     Mental Status: She is alert and oriented to person, place, and time.  Psychiatric:        Behavior: Behavior normal.        Judgment: Judgment normal.    BP (!) 122/58 (BP Location: Right Arm, Patient Position: Sitting, Cuff Size: Small)   Pulse 60   Temp 98.7 F (37.1 C) (Oral)   Resp 16   Wt 110 lb (49.9 kg)   SpO2 100%   BMI 24.66 kg/m  Wt Readings from Last 3 Encounters:  12/31/20 110 lb (49.9 kg)  01/20/20 111 lb 2 oz (50.4 kg)  06/18/19 121 lb (54.9 kg)    Diabetic Foot Exam - Simple   No data filed    Lab Results  Component Value Date   WBC 7.9 11/11/2018   HGB 10.7 (L) 11/11/2018   HCT 33.5 (L) 11/11/2018   PLT 329.0 11/11/2018   GLUCOSE 77 04/17/2018   CHOL 190 04/17/2018   TRIG 81.0 04/17/2018   HDL 54.00 04/17/2018   LDLCALC 119 (H) 04/17/2018   ALT 23 04/17/2018   AST 22 04/17/2018   NA 139 04/17/2018   K 4.3 04/17/2018   CL 105 04/17/2018   CREATININE 0.63 04/17/2018   BUN 9 04/17/2018   CO2 28 04/17/2018   TSH 1.80 02/08/2015   HGBA1C 5.3 04/17/2018    Lab Results  Component Value Date   TSH 1.80 02/08/2015   Lab Results  Component Value Date   WBC 7.9 11/11/2018   HGB 10.7 (L) 11/11/2018   HCT 33.5 (L) 11/11/2018   MCV 79.8 11/11/2018   PLT 329.0 11/11/2018   Lab Results  Component Value Date   NA 139 04/17/2018   K 4.3 04/17/2018   CO2 28  04/17/2018   GLUCOSE 77 04/17/2018   BUN 9 04/17/2018   CREATININE 0.63 04/17/2018   BILITOT 0.3 04/17/2018   ALKPHOS 61 04/17/2018   AST 22 04/17/2018   ALT 23 04/17/2018   PROT 6.9 04/17/2018   ALBUMIN 4.2 04/17/2018   CALCIUM 9.2 04/17/2018   GFR 106.15 04/17/2018   Lab Results  Component Value Date   CHOL 190 04/17/2018   Lab Results  Component Value Date   HDL 54.00 04/17/2018   Lab Results  Component Value Date   LDLCALC 119 (H) 04/17/2018   Lab Results  Component Value Date   TRIG 81.0 04/17/2018   Lab Results  Component Value Date   CHOLHDL 4 04/17/2018   Lab Results  Component Value Date   HGBA1C 5.3 04/17/2018       Assessment & Plan:   Problem List Items Addressed This Visit       Unprioritized   Globus sensation - Primary    New. Advised pt as follows:  Please begin omeprazole 40mg  once daily. Work on reflux diet changes as we discussed and as outlined in this packet. Take your fiber supplement once daily to prevent constipation and be sure to drink plenty of water.  Call if new/worsening symptoms.        Meds ordered this encounter  Medications   omeprazole (PRILOSEC) 40 MG capsule    Sig: Take 1 capsule (40 mg total) by mouth daily.    Dispense:  30 capsule    Refill:  3    Order Specific Question:   Supervising Provider    Answer:   Penni Homans A [id number]   Advised pt that routine colonoscopy screening will begin at 41.   I,Heather Mann,acting as a Education administrator for [employer], NP.,have documented all relevant documentation on the behalf of Nance Pear, NP,as directed by  Nance Pear, NP while in the presence of Nance Pear, NP.   I, [name], NP, personally preformed the services described in this documentation.  All medical record entries made by the scribe were at my direction and in my presence.  I have reviewed the chart and discharge instructions (if applicable) and agree that the record  reflects my personal performance and is accurate and complete. 12/31/2020

## 2020-12-31 NOTE — Assessment & Plan Note (Signed)
New. Advised pt as follows:  Please begin omeprazole 40mg  once daily. Work on reflux diet changes as we discussed and as outlined in this packet. Take your fiber supplement once daily to prevent constipation and be sure to drink plenty of water.  Call if new/worsening symptoms.

## 2021-02-26 DIAGNOSIS — M25562 Pain in left knee: Secondary | ICD-10-CM | POA: Diagnosis not present

## 2021-02-26 DIAGNOSIS — M25561 Pain in right knee: Secondary | ICD-10-CM | POA: Diagnosis not present

## 2022-04-29 NOTE — Patient Instructions (Incomplete)
It was good to see you again today I will be in touch with your labs Recommend COVID booster at your pharmacy if due

## 2022-04-29 NOTE — Progress Notes (Unsigned)
[place] at [facility] [address] [place], [place] [address] [phone]  Date:  05/03/2022   Name:  Heather Mann   DOB:  1980/11/25   MRN:  [id number]  PCP:  Darreld Mclean, MD    Chief Complaint: No chief complaint on file.   History of Present Illness:  Heather Mann is a 42 y.o. very pleasant female patient who presents with the following:  Patient seen today for follow-up Most recent visit with myself was almost 3 years ago, April 2021 History of anemia, hyperlipidemia Patient is originally from [place], married to [place] Most recent labs on chart from 2020-need to update  Mammogram- Pap smear 2018, can be updated  Flu shot Tetanus vaccine is due Recommend COVID booster Patient Active Problem List   Diagnosis Date Noted   Globus sensation 12/31/2020   Internal and external prolapsed hemorrhoids 03/17/2020   Breast lesion on mammography 02/13/2015   Menorrhagia with regular cycle 02/13/2015   Hyperlipidemia, mixed 02/13/2015   Constipation 02/08/2014   Anemia 08/10/2013    Past Medical History:  Diagnosis Date   Arrest of descent, delivered, current hospitalization 01/22/2013   Formatting of this note might be different from the original. S/p PLTCS by LBR on 01/22/13 Female "[name]"   Atypical chest pain 10/18/2014   Constipation 02/08/2014   Pregnancy 06/26/2012   Preventative health care 02/08/2014    Past Surgical History:  Procedure Laterality Date   CESAREAN SECTION  01-22-13    Social History   Tobacco Use   Smoking status: Never   Smokeless tobacco: Never  Vaping Use   Vaping Use: Never used  Substance Use Topics   Alcohol use: No   Drug use: No    Family History  Problem Relation Age of Onset   Hypertension Mother     Allergies  Allergen Reactions   Oxycodone Hives    Medication list has been reviewed and updated.  Current Outpatient Medications on File Prior to Visit   Medication Sig Dispense Refill   omeprazole (PRILOSEC) 40 MG capsule Take 1 capsule (40 mg total) by mouth daily. 30 capsule 3   No current facility-administered medications on file prior to visit.    Review of Systems:  As per HPI- otherwise negative.   Physical Examination: There were no vitals filed for this visit. There were no vitals filed for this visit. There is no height or weight on file to calculate BMI. Ideal Body Weight:    GEN: no acute distress. HEENT: Atraumatic, Normocephalic.  Ears and Nose: No external deformity. CV: RRR, No M/G/R. No JVD. No thrill. No extra heart sounds. PULM: CTA B, no wheezes, crackles, rhonchi. No retractions. No resp. distress. No accessory muscle use. ABD: S, NT, ND, +BS. No rebound. No HSM. EXTR: No c/c/e PSYCH: Normally interactive. Conversant.    Assessment and Plan: ***  Signed Lamar Blinks, MD

## 2022-05-03 ENCOUNTER — Other Ambulatory Visit (HOSPITAL_COMMUNITY)
Admission: RE | Admit: 2022-05-03 | Discharge: 2022-05-03 | Disposition: A | Discharge status: Home / Self Care | Payer: BC Managed Care – PPO | Source: Ambulatory Visit | Attending: Family Medicine | Admitting: Family Medicine

## 2022-05-03 ENCOUNTER — Ambulatory Visit: Payer: BC Managed Care – PPO | Admitting: Family Medicine

## 2022-05-03 ENCOUNTER — Encounter: Payer: Self-pay | Admitting: Family Medicine

## 2022-05-03 VITALS — BP 123/64 | HR 73 | Temp 98.2°F | Resp 16 | Ht 59.0 in | Wt 116.0 lb

## 2022-05-03 DIAGNOSIS — Z124 Encounter for screening for malignant neoplasm of cervix: Secondary | ICD-10-CM | POA: Insufficient documentation

## 2022-05-03 DIAGNOSIS — Z131 Encounter for screening for diabetes mellitus: Secondary | ICD-10-CM

## 2022-05-03 DIAGNOSIS — Z23 Encounter for immunization: Secondary | ICD-10-CM | POA: Diagnosis not present

## 2022-05-03 DIAGNOSIS — E782 Mixed hyperlipidemia: Secondary | ICD-10-CM

## 2022-05-03 DIAGNOSIS — Z1329 Encounter for screening for other suspected endocrine disorder: Secondary | ICD-10-CM

## 2022-05-03 DIAGNOSIS — D539 Nutritional anemia, unspecified: Secondary | ICD-10-CM | POA: Diagnosis not present

## 2022-05-03 DIAGNOSIS — K649 Unspecified hemorrhoids: Secondary | ICD-10-CM

## 2022-05-03 DIAGNOSIS — D509 Iron deficiency anemia, unspecified: Secondary | ICD-10-CM

## 2022-05-04 ENCOUNTER — Telehealth: Payer: Self-pay | Admitting: Family Medicine

## 2022-05-04 LAB — CBC
HCT: 31.4 % — ABNORMAL LOW (ref 36.0–46.0)
Hemoglobin: 10 g/dL — ABNORMAL LOW (ref 12.0–15.0)
MCHC: 32 g/dL (ref 30.0–36.0)
MCV: 73.7 fl — ABNORMAL LOW (ref 78.0–100.0)
Platelets: 364 10*3/uL (ref 150.0–400.0)
RBC: 4.26 Mil/uL (ref 3.87–5.11)
RDW: 19.1 % — ABNORMAL HIGH (ref 11.5–15.5)
WBC: 6.2 10*3/uL (ref 4.0–10.5)

## 2022-05-04 LAB — COMPREHENSIVE METABOLIC PANEL
ALT: 17 U/L (ref 0–35)
AST: 24 U/L (ref 0–37)
Albumin: 4.6 g/dL (ref 3.5–5.2)
Alkaline Phosphatase: 61 U/L (ref 39–117)
BUN: 8 mg/dL (ref 6–23)
CO2: 28 mEq/L (ref 19–32)
Calcium: 9.7 mg/dL (ref 8.4–10.5)
Chloride: 104 mEq/L (ref 96–112)
Creatinine, Ser: 0.73 mg/dL (ref 0.40–1.20)
GFR: 102.2 mL/min (ref >60.00)
Glucose, Bld: 70 mg/dL (ref 70–99)
Potassium: 3.5 mEq/L (ref 3.5–5.1)
Sodium: 140 mEq/L (ref 135–145)
Total Bilirubin: 0.4 mg/dL (ref 0.2–1.2)
Total Protein: 7.7 g/dL (ref 6.0–8.3)

## 2022-05-04 LAB — TSH: TSH: 2.12 u[IU]/mL (ref 0.35–5.50)

## 2022-05-04 LAB — LIPID PANEL
Cholesterol: 219 mg/dL — ABNORMAL HIGH (ref 0–200)
HDL: 64.8 mg/dL (ref >39.00)
LDL Cholesterol: 124 mg/dL — ABNORMAL HIGH (ref 0–99)
NonHDL: 153.75
Total CHOL/HDL Ratio: 3
Triglycerides: 148 mg/dL (ref 0.0–149.0)
VLDL: 29.6 mg/dL (ref 0.0–40.0)

## 2022-05-04 LAB — HEMOGLOBIN A1C: Hgb A1c MFr Bld: 5.5 % (ref 4.6–6.5)

## 2022-05-04 NOTE — Telephone Encounter (Signed)
Shanon Brow (spouse) called stating that [facility] told them it was too soon for a mammogram and that they may need to [facility] [place]. Please Advise.

## 2022-05-04 NOTE — Addendum Note (Signed)
Addended by: Darreld Mclean on: 05/04/2022 01:48 PM   Modules accepted: Orders

## 2022-05-05 LAB — CYTOLOGY - PAP
Comment: NEGATIVE
Diagnosis: NEGATIVE
High risk HPV: NEGATIVE

## 2022-05-08 NOTE — Telephone Encounter (Signed)
Called them back- [facility] for Shanon Brow -I was not sure from their message if they need a referral for diagnostic mammogram or what is necessary.  Please come back and let me know  I will also send a letter with her Pap and this question

## 2022-06-23 NOTE — Progress Notes (Unsigned)
[facility] at [facility] [address] [place], [place] [address] [phone]  Date:  06/26/2022   Name:  Heather Mann   DOB:  [birth year]   MRN:  [id number]  PCP:  Pearline Cables, MD    Chief Complaint: discuss anemia (Concerns/ questions: 1. Knot on the L side of her neck that she noticed x 3 months ago. 2.pt would like a colonoscopy d/t family hx. )   History of Present Illness:  Heather Mann is a 42 y.o. very pleasant female patient who presents with the following:  Pt seen today for a recheck visit Last seen by myself in February  History of anemia, hyperlipidemia Patient is originally from [place] married to [employer] done in February did show anemia Your labs look fine except for anemia- this is not a new finding but I would like to try and figure out why you are anemic. If you would, please schedule a lab visit only at your convenience so I can check your iron level and a few other things. Otherwise we can plan to visit in about one year.  Take care!  She gets montly bleeding, she will bleed for about 3 days - she is bleeding less than when she was younger  Here today with her husband and 52 yo daughter She notes a family history of colon ca in more distant relative like uncles- she is uncertain age of their cancer dx  She denies any first-degree relative family history of colon cancer   The 10-year ASCVD risk score (Arnett DK, et al., 2019) is: 0.4%   Values used to calculate the score:     Age: 42 years     Sex: Female     Is Non-Hispanic African American: No     Diabetic: No     Tobacco smoker: No     Systolic Blood Pressure: 110 mmHg     Is BP treated: No     HDL Cholesterol: 64.8 mg/dL     Total Cholesterol: 219 mg/dL  Patient Active Problem List   Diagnosis Date Noted   Globus sensation 12/31/2020   Internal and external prolapsed hemorrhoids 03/17/2020   Breast lesion on mammography 02/13/2015    Menorrhagia with regular cycle 02/13/2015   Hyperlipidemia, mixed 02/13/2015   Constipation 02/08/2014   Anemia 08/10/2013    Past Medical History:  Diagnosis Date   Arrest of descent, delivered, current hospitalization 01/22/2013   Formatting of this note might be different from the original. S/p PLTCS by LBR on 01/22/13 Female "[name]"   Atypical chest pain 10/18/2014   Constipation 02/08/2014   Pregnancy 06/26/2012   Preventative health care 02/08/2014    Past Surgical History:  Procedure Laterality Date   CESAREAN SECTION  01-22-13    Social History   Tobacco Use   Smoking status: Never   Smokeless tobacco: Never  Vaping Use   Vaping Use: Never used  Substance Use Topics   Alcohol use: No   Drug use: No    Family History  Problem Relation Age of Onset   Hypertension Mother     Allergies  Allergen Reactions   Oxycodone Hives    Medication list has been reviewed and updated.  Current Outpatient Medications on File Prior to Visit  Medication Sig Dispense Refill   omeprazole (PRILOSEC) 40 MG capsule Take 1 capsule (40 mg total) by mouth daily. 30 capsule 3   No current facility-administered medications  on file prior to visit.    Review of Systems:  As per HPI- otherwise negative.   Physical Examination: Vitals:   06/26/22 1605  BP: 110/70  Pulse: (!) 58  Resp: 18  Temp: 98 F (36.7 C)   Vitals:   06/26/22 1605  Weight: 115 lb 12.8 oz (52.5 kg)  Height:  (1.499 m)   Body mass index is 23.39 kg/m. Ideal Body Weight: Weight in (lb) to have BMI = 25: 123.5  GEN: no acute distress. Petite build, looks well  HEENT: Atraumatic, Normocephalic.  Bilateral TM wnl, oropharynx normal.  PEERL,EOMI.   Ears and Nose: No external deformity. CV: RRR, No M/G/R. No JVD. No thrill. No extra heart sounds. PULM: CTA B, no wheezes, crackles, rhonchi. No retractions. No resp. distress. No accessory muscle use. ABD: S, NT, ND, +BS. No rebound. No HSM. EXTR:  No c/c/e PSYCH: Normally interactive. Conversant.  Very small, non pathologic right sided posterior cervical node   Assessment and Plan: Microcytic anemia - Plan: Ferritin, Hgb Fractionation Cascade, CBC with Differential/Platelet, B12, Folate  Following up on microcytic anemia today - labs pending as above Also consider possible need for early colonoscopy if we do see persistent anemia   Signed Abbe Amsterdam, MD  Addendum 4/18, received labs as below.  Gave a call as she does not have MyChart Spoke with her husband Heather Hua, advised I would like to have her see hematology for possible IV iron infusion.  He states agreement.  Will place referral  Results for orders placed or performed in visit on 06/26/22  Ferritin  Result Value Ref Range   Ferritin 6.7 (L) 10.0 - 291.0 ng/mL  Hgb Fractionation Cascade  Result Value Ref Range   Hgb F 0.0 0.0 - 2.0 %   Hgb A 97.6 96.4 - 98.8 %   Hgb A2 2.4 1.8 - 3.2 %   Hgb S 0.0 0.0 %   Interpretation, Hgb Fract Comment   CBC with Differential/Platelet  Result Value Ref Range   WBC 6.9 4.0 - 10.5 K/uL   RBC 4.41 3.87 - 5.11 Mil/uL   Hemoglobin 11.4 (L) 12.0 - 15.0 g/dL   HCT 04.5 (L) 40.9 - 81.1 %   MCV 80.1 78.0 - 100.0 fl   MCHC 32.3 30.0 - 36.0 g/dL   RDW 91.4 (H) 78.2 - 95.6 %   Platelets 329.0 150.0 - 400.0 K/uL   Neutrophils Relative % 65.1 43.0 - 77.0 %   Lymphocytes Relative 22.1 12.0 - 46.0 %   Monocytes Relative 9.7 3.0 - 12.0 %   Eosinophils Relative 2.4 0.0 - 5.0 %   Basophils Relative 0.7 0.0 - 3.0 %   Neutro Abs 4.5 1.4 - 7.7 K/uL   Lymphs Abs 1.5 0.7 - 4.0 K/uL   Monocytes Absolute 0.7 0.1 - 1.0 K/uL   Eosinophils Absolute 0.2 0.0 - 0.7 K/uL   Basophils Absolute 0.0 0.0 - 0.1 K/uL  B12  Result Value Ref Range   Vitamin B-12 382 211 - 911 pg/mL  Folate  Result Value Ref Range   Folate 18.4 >5.9 ng/mL  Comment: Normal hemoglobin present; no hemoglobin variant or beta thalassemia identified. Note: Alpha thalassemia  may not be detected by the Hgb Fractionation Cascade panel. If alpha thalassemia is suspected, [facility] offers Alpha-Thalassemia DNA Analysis [phone]).

## 2022-06-26 ENCOUNTER — Ambulatory Visit: Payer: BC Managed Care – PPO | Admitting: Family Medicine

## 2022-06-26 VITALS — BP 110/70 | HR 58 | Temp 98.0°F | Resp 18 | Ht 59.0 in | Wt 115.8 lb

## 2022-06-26 DIAGNOSIS — E611 Iron deficiency: Secondary | ICD-10-CM

## 2022-06-26 DIAGNOSIS — D509 Iron deficiency anemia, unspecified: Secondary | ICD-10-CM | POA: Diagnosis not present

## 2022-06-26 NOTE — Patient Instructions (Signed)
I will be in touch with your labs asap!  

## 2022-06-27 LAB — CBC WITH DIFFERENTIAL/PLATELET
Basophils Absolute: 0 10*3/uL (ref 0.0–0.1)
Basophils Relative: 0.7 % (ref 0.0–3.0)
Eosinophils Absolute: 0.2 10*3/uL (ref 0.0–0.7)
Eosinophils Relative: 2.4 % (ref 0.0–5.0)
HCT: 35.3 % — ABNORMAL LOW (ref 36.0–46.0)
Hemoglobin: 11.4 g/dL — ABNORMAL LOW (ref 12.0–15.0)
Lymphocytes Relative: 22.1 % (ref 12.0–46.0)
Lymphs Abs: 1.5 10*3/uL (ref 0.7–4.0)
MCHC: 32.3 g/dL (ref 30.0–36.0)
MCV: 80.1 fl (ref 78.0–100.0)
Monocytes Absolute: 0.7 10*3/uL (ref 0.1–1.0)
Monocytes Relative: 9.7 % (ref 3.0–12.0)
Neutro Abs: 4.5 10*3/uL (ref 1.4–7.7)
Neutrophils Relative %: 65.1 % (ref 43.0–77.0)
Platelets: 329 10*3/uL (ref 150.0–400.0)
RBC: 4.41 Mil/uL (ref 3.87–5.11)
RDW: 22.3 % — ABNORMAL HIGH (ref 11.5–15.5)
WBC: 6.9 10*3/uL (ref 4.0–10.5)

## 2022-06-27 LAB — FOLATE: Folate: 18.4 ng/mL (ref >5.9)

## 2022-06-27 LAB — FERRITIN: Ferritin: 6.7 ng/mL — ABNORMAL LOW (ref 10.0–291.0)

## 2022-06-27 LAB — VITAMIN B12: Vitamin B-12: 382 pg/mL (ref 211–911)

## 2022-06-29 LAB — HGB FRACTIONATION CASCADE
Hgb A2: 2.4 % (ref 1.8–3.2)
Hgb A: 97.6 % (ref 96.4–98.8)
Hgb F: 0 % (ref 0.0–2.0)
Hgb S: 0 %

## 2022-06-29 NOTE — Addendum Note (Signed)
Addended by: Abbe Amsterdam C on: 06/29/2022 12:56 PM   Modules accepted: Orders

## 2022-07-05 ENCOUNTER — Other Ambulatory Visit: Payer: Self-pay | Admitting: Family

## 2022-07-05 DIAGNOSIS — D649 Anemia, unspecified: Secondary | ICD-10-CM

## 2022-07-06 ENCOUNTER — Inpatient Hospital Stay: Payer: BC Managed Care – PPO

## 2022-07-06 ENCOUNTER — Encounter: Payer: BC Managed Care – PPO | Admitting: Medical Oncology

## 2022-07-07 ENCOUNTER — Encounter: Payer: Self-pay | Admitting: Family

## 2022-07-07 ENCOUNTER — Inpatient Hospital Stay: Payer: BC Managed Care – PPO | Admitting: Family

## 2022-07-07 ENCOUNTER — Other Ambulatory Visit: Payer: Self-pay

## 2022-07-07 ENCOUNTER — Inpatient Hospital Stay: Payer: BC Managed Care – PPO | Attending: Hematology & Oncology

## 2022-07-07 VITALS — BP 122/62 | HR 79 | Temp 98.0°F | Resp 19 | Ht 59.0 in | Wt 116.1 lb

## 2022-07-07 DIAGNOSIS — D500 Iron deficiency anemia secondary to blood loss (chronic): Secondary | ICD-10-CM | POA: Diagnosis not present

## 2022-07-07 DIAGNOSIS — N920 Excessive and frequent menstruation with regular cycle: Secondary | ICD-10-CM | POA: Diagnosis not present

## 2022-07-07 DIAGNOSIS — D649 Anemia, unspecified: Secondary | ICD-10-CM

## 2022-07-07 DIAGNOSIS — D509 Iron deficiency anemia, unspecified: Secondary | ICD-10-CM | POA: Insufficient documentation

## 2022-07-07 LAB — CBC WITH DIFFERENTIAL (CANCER CENTER ONLY)
Abs Immature Granulocytes: 0.05 10*3/uL (ref 0.00–0.07)
Basophils Absolute: 0.1 10*3/uL (ref 0.0–0.1)
Basophils Relative: 1 %
Eosinophils Absolute: 0.1 10*3/uL (ref 0.0–0.5)
Eosinophils Relative: 2 %
HCT: 31.8 % — ABNORMAL LOW (ref 36.0–46.0)
Hemoglobin: 10.1 g/dL — ABNORMAL LOW (ref 12.0–15.0)
Immature Granulocytes: 1 %
Lymphocytes Relative: 28 %
Lymphs Abs: 1.5 10*3/uL (ref 0.7–4.0)
MCH: 25.8 pg — ABNORMAL LOW (ref 26.0–34.0)
MCHC: 31.8 g/dL (ref 30.0–36.0)
MCV: 81.3 fL (ref 80.0–100.0)
Monocytes Absolute: 0.4 10*3/uL (ref 0.1–1.0)
Monocytes Relative: 8 %
Neutro Abs: 3.2 10*3/uL (ref 1.7–7.7)
Neutrophils Relative %: 60 %
Platelet Count: 297 10*3/uL (ref 150–400)
RBC: 3.91 MIL/uL (ref 3.87–5.11)
RDW: 20.2 % — ABNORMAL HIGH (ref 11.5–15.5)
WBC Count: 5.3 10*3/uL (ref 4.0–10.5)
nRBC: 0 % (ref 0.0–0.2)

## 2022-07-07 LAB — IRON AND IRON BINDING CAPACITY (CC-WL,HP ONLY)
Iron: 29 ug/dL (ref 28–170)
Saturation Ratios: 7 % — ABNORMAL LOW (ref 10.4–31.8)
TIBC: 424 ug/dL (ref 250–450)
UIBC: 395 ug/dL (ref 148–442)

## 2022-07-07 LAB — RETICULOCYTES
Immature Retic Fract: 12.9 % (ref 2.3–15.9)
RBC.: 3.91 MIL/uL (ref 3.87–5.11)
Retic Count, Absolute: 59 10*3/uL (ref 19.0–186.0)
Retic Ct Pct: 1.5 % (ref 0.4–3.1)

## 2022-07-07 LAB — LACTATE DEHYDROGENASE: LDH: 173 U/L (ref 98–192)

## 2022-07-07 NOTE — Progress Notes (Signed)
Hematology/Oncology Consultation   Name: Heather Mann      MRN: [id number]    Location: Room/bed info not found  Date: 07/07/2022 Time:10:57 AM   REFERRING PHYSICIAN: Abbe Amsterdam, MD  REASON FOR CONSULT: Iron deficiency anemia, microcytic anemia    DIAGNOSIS: Iron deficiency anemia secondary to heavy cycles  HISTORY OF PRESENT ILLNESS: Heather Mann is a pleasant 42 yo female with iron deficiency anemia. Ferritin last week was down to 6.7. She states that her cycle is regular with heavy flow and clots.  She states that she has had blood in her stool with constipation and straining. This has since resolved and she has had no recent issues.  No bruising or petechiae.  She is symptomatic with chills and being cold natured.  No known familial history of anemia.  She has 1 child and no history of cancer.  She states that she had her mammogram within the last year and result was negative. She is due again next month.  No personal or known familial history of cancer.  No fever, n/v, cough, rash, dizziness, SOB, chest pain, palpitations, abdominal pain or changes in bowel or bladder habits.  No swelling, tenderness, numbness or tingling in her extremities.  No falls or syncope.  Appetite and hydration are good. Weight is stable at 116 lbs.  No smoking, ETOH or recreational drug use.  No falls or syncope reported.  She works Magazine features editor at [employer]. Her husband is with her today.  She is quite active doing Zumba and walks a lot for her job.   ROS: All other 10 point review of systems is negative.   PAST MEDICAL HISTORY:   Past Medical History:  Diagnosis Date   Arrest of descent, delivered, current hospitalization 01/22/2013   Formatting of this note might be different from the original. S/p PLTCS by LBR on 01/22/13 Female "Heather Mann"   Atypical chest pain 10/18/2014   Constipation 02/08/2014   Pregnancy 06/26/2012   Preventative health care 02/08/2014    ALLERGIES: Allergies  Allergen  Reactions   Oxycodone Hives      MEDICATIONS:  Current Outpatient Medications on File Prior to Visit  Medication Sig Dispense Refill   omeprazole (PRILOSEC) 40 MG capsule Take 1 capsule (40 mg total) by mouth daily. 30 capsule 3   No current facility-administered medications on file prior to visit.     PAST SURGICAL HISTORY Past Surgical History:  Procedure Laterality Date   CESAREAN SECTION  01-22-13    FAMILY HISTORY: Family History  Problem Relation Age of Onset   Hypertension Mother     SOCIAL HISTORY:  reports that she has never smoked. She has never used smokeless tobacco. She reports that she does not drink alcohol and does not use drugs.  PERFORMANCE STATUS: [name] performance status is 1 - Symptomatic but completely ambulatory  PHYSICAL EXAM: Most Recent Vital Signs: There were no vitals taken for this visit. BP 122/62 (BP Location: Left Arm, Patient Position: Sitting)   Pulse 79   Temp 98 F (36.7 C) (Oral)   Resp 19   Ht 4\' 11"  (1.499 m)   Wt 116 lb 1.3 oz (52.7 kg)   SpO2 100%   BMI 23.45 kg/m   General Appearance:    Alert, cooperative, no distress, appears stated age  Head:    Normocephalic, without obvious abnormality, atraumatic  Eyes:    PERRL, conjunctiva/corneas clear, EOM's intact, fundi    benign, both eyes  Throat:   Lips, mucosa, and tongue normal; teeth and gums normal  Neck:   Supple, symmetrical, trachea midline, no adenopathy;    thyroid:  no enlargement/tenderness/nodules; no carotid   bruit or JVD  Back:     Symmetric, no curvature, ROM normal, no CVA tenderness  Lungs:     Clear to auscultation bilaterally, respirations unlabored  Chest Wall:    No tenderness or deformity   Heart:    Regular rate and rhythm, S1 and S2 normal, no murmur, rub   or gallop     Abdomen:     Soft, non-tender, bowel sounds active all four quadrants,    no masses, no organomegaly        Extremities:   Extremities normal, atraumatic, no  cyanosis or edema  Pulses:   2+ and symmetric all extremities  Skin:   Skin color, texture, turgor normal, no rashes or lesions  Lymph nodes:   Cervical, supraclavicular, and axillary nodes normal  Neurologic:   CNII-XII intact, normal strength, sensation and reflexes    throughout    LABORATORY DATA:  No results found for this or any previous visit (from the past 48 hour(s)).    RADIOGRAPHY: No results found.     PATHOLOGY: None   ASSESSMENT/PLAN: Heather Mann is a pleasant 42 yo female with iron deficiency anemia secondary to heavy cycles.  We will get her set up for 3 doses of IV iron.  Follow-up in 2 months.   All questions were answered. The patient knows to call the clinic with any problems, questions or concerns. We can certainly see the patient much sooner if necessary.   Heather Stanford, NP

## 2022-07-08 LAB — ERYTHROPOIETIN: Erythropoietin: 44.8 m[IU]/mL — ABNORMAL HIGH (ref 2.6–18.5)

## 2022-07-28 LAB — ALPHA-THALASSEMIA GENOTYPR

## 2023-01-09 NOTE — Progress Notes (Addendum)
[facility] [address] [place], [place] [address] [phone]  Date:  01/11/2023   Name:  Heather Mann   DOB:  1980/05/29   MRN:  [id number]  PCP:  Pearline Cables, MD    Chief Complaint: Breast Pain (Pain in both breast /Onset 2-3 weeks )   History of Present Illness:  Heather Mann is a 42 y.o. very pleasant female patient who presents with the following:  Patient seen today with concern of breast pain  History of anemia, hyperlipidemia  Most recent visit with myself was in April of this year at which time she was found to have microcytic anemia with very low iron I refer her to hematology, they ordered IV iron infusions but I am not quite sure this happened  Most recent mammogram was in 2018-diagnostic.  They recommended a screening in 1 year at that time She may have had a mammogram done at [facility] imaging in the meantime  She has noted bilateral breast pain for about 3 weeks-seems about the same in both sides, no masses or other abnormality.  She otherwise feels fine  She did a mammo they report in May of 2023- negative IMPRESSION: No significant abnormality or change.  RECOMMENDATION: Screening mammogram in one year if age 36 or above.  ACR BI-RADS CATEGORY 2 - Benign.  Letter Sent: Benign Exam.   Abbreviated Screening Breast MRI (ASBMRI) is offered by [facility] as a  supplemental screening exam for patients with dense breast tissue, in whom  mammography is less sensitive. For more information, please call [phone] or visit [email or web address].   LMP 2 weeks ago   Patient Active Problem List   Diagnosis Date Noted   IDA (iron deficiency anemia) 07/07/2022   Globus sensation 12/31/2020   Internal and external prolapsed hemorrhoids 03/17/2020   Breast lesion on mammography 02/13/2015   Menorrhagia with regular cycle 02/13/2015   Hyperlipidemia, mixed 02/13/2015   Constipation  02/08/2014   Anemia 08/10/2013    Past Medical History:  Diagnosis Date   Arrest of descent, delivered, current hospitalization 01/22/2013   Formatting of this note might be different from the original. S/p PLTCS by [facility] on 01/22/13 Female "[name]"   Atypical chest pain 10/18/2014   Constipation 02/08/2014   Pregnancy 06/26/2012   Preventative health care 02/08/2014    Past Surgical History:  Procedure Laterality Date   CESAREAN SECTION  01-22-13    Social History   Tobacco Use   Smoking status: Never   Smokeless tobacco: Never  Vaping Use   Vaping status: Never Used  Substance Use Topics   Alcohol use: No   Drug use: No    Family History  Problem Relation Age of Onset   Hypertension Mother     Allergies  Allergen Reactions   Oxycodone Hives    Medication list has been reviewed and updated.  Current Outpatient Medications on File Prior to Visit  Medication Sig Dispense Refill   omeprazole (PRILOSEC) 40 MG capsule Take 1 capsule (40 mg total) by mouth daily. (Patient not taking: Reported on 01/11/2023) 30 capsule 3   No current facility-administered medications on file prior to visit.    Review of Systems:  As per HPI- otherwise negative.   Physical Examination: Vitals:   01/11/23 1525  BP: 110/70  Pulse: 66  Resp: 18  SpO2: 99%   Vitals:   01/11/23 1525  Weight: 116 lb 6.4 oz (  52.8 kg)  Height: 4\' 11"  (1.499 m)   Body mass index is 23.51 kg/m. Ideal Body Weight: Weight in (lb) to have BMI = 25: 123.5  GEN: no acute distress.  Normal weight, petite build HEENT: Atraumatic, Normocephalic.  Ears and Nose: No external deformity. CV: RRR, No M/G/R. No JVD. No thrill. No extra heart sounds. PULM: CTA B, no wheezes, crackles, rhonchi. No retractions. No resp. distress. No accessory muscle use. ABD: S, NT, ND EXTR: No c/c/e PSYCH: Normally interactive. Conversant.  Normal breast exam bilaterally, no masses, dimpling, discharge,  tenderness  Results for orders placed or performed in visit on 01/11/23  POCT urine pregnancy  Result Value Ref Range   Preg Test, Ur Negative Negative    Assessment and Plan: Breast pain - Plan: POCT urine pregnancy, MM DIAG BREAST TOMO BILAT NO CAD, CANCELED: MM DIAG BREAST TOMO BILAT NO CAD  Microcytic anemia - Plan: CBC, Ferritin  Screening for diabetes mellitus - Plan: Comprehensive metabolic panel Patient seen today with concern of bilateral breast pain.  As I recall we have seen this with her in the past, I believe in 2020.  Clinical exam today is normal, we will set her up for a diagnostic mammogram hCG is negative Will follow-up on history of microcytic anemia with CBC and ferritin Will also check metabolic profile  Signed Abbe Amsterdam, MD  Received labs as below, 11/1. Called to discuss with them- her husband states they stopped attending follow-up at hematology due to some concerns about insurance coverage for her iron treatments.  However, they are willing to try this again.  I will get in touch with hematology and ask them to bring the patient back again Results for orders placed or performed in visit on 01/11/23  CBC  Result Value Ref Range   WBC 5.4 4.0 - 10.5 K/uL   RBC 3.74 (L) 3.87 - 5.11 Mil/uL   Platelets 341.0 150.0 - 400.0 K/uL   Hemoglobin 10.6 (L) 12.0 - 15.0 g/dL   HCT 95.6 (L) 21.3 - 08.6 %   MCV 88.3 78.0 - 100.0 fl   MCHC 32.1 30.0 - 36.0 g/dL   RDW 57.8 46.9 - 62.9 %  Comprehensive metabolic panel  Result Value Ref Range   Sodium 137 135 - 145 mEq/L   Potassium 3.8 3.5 - 5.1 mEq/L   Chloride 102 96 - 112 mEq/L   CO2 28 19 - 32 mEq/L   Glucose, Bld 81 70 - 99 mg/dL   BUN 6 6 - 23 mg/dL   Creatinine, Ser 5.28 0.40 - 1.20 mg/dL   Total Bilirubin 0.4 0.2 - 1.2 mg/dL   Alkaline Phosphatase 69 39 - 117 U/L   AST 22 0 - 37 U/L   ALT 16 0 - 35 U/L   Total Protein 7.2 6.0 - 8.3 g/dL   Albumin 4.4 3.5 - 5.2 g/dL   GFR 413.24 >40.10 mL/min    Calcium 9.5 8.4 - 10.5 mg/dL  Ferritin  Result Value Ref Range   Ferritin 3.9 (L) 10.0 - 291.0 ng/mL  POCT urine pregnancy  Result Value Ref Range   Preg Test, Ur Negative Negative

## 2023-01-11 ENCOUNTER — Encounter: Payer: Self-pay | Admitting: Family Medicine

## 2023-01-11 ENCOUNTER — Ambulatory Visit (INDEPENDENT_AMBULATORY_CARE_PROVIDER_SITE_OTHER): Payer: BC Managed Care – PPO | Admitting: Family Medicine

## 2023-01-11 VITALS — BP 110/70 | HR 66 | Resp 18 | Ht 59.0 in | Wt 116.4 lb

## 2023-01-11 DIAGNOSIS — Z131 Encounter for screening for diabetes mellitus: Secondary | ICD-10-CM

## 2023-01-11 DIAGNOSIS — N644 Mastodynia: Secondary | ICD-10-CM

## 2023-01-11 DIAGNOSIS — D509 Iron deficiency anemia, unspecified: Secondary | ICD-10-CM

## 2023-01-11 LAB — POCT URINE PREGNANCY: Preg Test, Ur: NEGATIVE

## 2023-01-11 NOTE — Patient Instructions (Signed)
It was good to see you today- I will be in touch with your labs and we will set you up for a mammogram I ordered your mammogram to be done at [facility] - please give them a call to schedule at your convenience You will have a diagnostic mammogram since you have noticed pain Let me know if any difficulty setting this up  I did a pregnancy test today due to breast pain- if this comes back positive I will call you!   [facility] Address: [address], [place], [place] [address] Phone: [phone]

## 2023-01-12 LAB — CBC
HCT: 33 % — ABNORMAL LOW (ref 36.0–46.0)
Hemoglobin: 10.6 g/dL — ABNORMAL LOW (ref 12.0–15.0)
MCHC: 32.1 g/dL (ref 30.0–36.0)
MCV: 88.3 fL (ref 78.0–100.0)
Platelets: 341 10*3/uL (ref 150.0–400.0)
RBC: 3.74 Mil/uL — ABNORMAL LOW (ref 3.87–5.11)
RDW: 14.3 % (ref 11.5–15.5)
WBC: 5.4 10*3/uL (ref 4.0–10.5)

## 2023-01-12 LAB — COMPREHENSIVE METABOLIC PANEL
ALT: 16 U/L (ref 0–35)
AST: 22 U/L (ref 0–37)
Albumin: 4.4 g/dL (ref 3.5–5.2)
Alkaline Phosphatase: 69 U/L (ref 39–117)
BUN: 6 mg/dL (ref 6–23)
CO2: 28 meq/L (ref 19–32)
Calcium: 9.5 mg/dL (ref 8.4–10.5)
Chloride: 102 meq/L (ref 96–112)
Creatinine, Ser: 0.72 mg/dL (ref 0.40–1.20)
GFR: 103.4 mL/min (ref >60.00)
Glucose, Bld: 81 mg/dL (ref 70–99)
Potassium: 3.8 meq/L (ref 3.5–5.1)
Sodium: 137 meq/L (ref 135–145)
Total Bilirubin: 0.4 mg/dL (ref 0.2–1.2)
Total Protein: 7.2 g/dL (ref 6.0–8.3)

## 2023-01-12 LAB — FERRITIN: Ferritin: 3.9 ng/mL — ABNORMAL LOW (ref 10.0–291.0)

## 2023-01-19 ENCOUNTER — Telehealth: Payer: Self-pay | Admitting: *Deleted

## 2023-01-19 NOTE — Telephone Encounter (Signed)
Per staff message Maralyn Sago - called patient and lvm of up coming appointments and requested call back to confirm.

## 2023-01-22 ENCOUNTER — Inpatient Hospital Stay: Payer: BC Managed Care – PPO | Attending: Medical Oncology

## 2023-01-22 ENCOUNTER — Inpatient Hospital Stay: Payer: BC Managed Care – PPO | Admitting: Medical Oncology

## 2023-02-06 ENCOUNTER — Telehealth: Payer: Self-pay | Admitting: Family Medicine

## 2023-02-06 NOTE — Telephone Encounter (Signed)
Order has been faxed to imaging center.

## 2023-02-06 NOTE — Telephone Encounter (Signed)
Pt's spouse called an state that he called [facility] in [place] to schedule the patient's mammogram. However, he was told that they have not received it. Please call and advise.

## 2023-02-06 NOTE — Addendum Note (Signed)
Addended by: Wellington Hampshire L on: 02/06/2023 12:46 PM   Modules accepted: Orders
